# Patient Record
Sex: Male | Born: 2000 | Race: White | Hispanic: No | Marital: Single | Smoking: Never smoker
Health system: Southern US, Community
[De-identification: ages and names within clinical notes are randomized; demographics above are authoritative.]

## PROBLEM LIST (undated history)

## (undated) DIAGNOSIS — F4310 Post-traumatic stress disorder, unspecified: Secondary | ICD-10-CM

## (undated) DIAGNOSIS — T1491XA Suicide attempt, initial encounter: Secondary | ICD-10-CM

## (undated) DIAGNOSIS — F3181 Bipolar II disorder: Secondary | ICD-10-CM

## (undated) DIAGNOSIS — F32A Depression, unspecified: Secondary | ICD-10-CM

## (undated) DIAGNOSIS — F419 Anxiety disorder, unspecified: Secondary | ICD-10-CM

## (undated) DIAGNOSIS — S069X9A Unspecified intracranial injury with loss of consciousness of unspecified duration, initial encounter: Secondary | ICD-10-CM

## (undated) HISTORY — PX: WISDOM TOOTH EXTRACTION: SHX21

---
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
## 2018-02-04 ENCOUNTER — Emergency Department
Admission: EM | Admit: 2018-02-04 | Discharge: 2018-02-05 | Disposition: A | Discharge status: Home / Self Care | Payer: 59 | Attending: Emergency Medicine | Admitting: Emergency Medicine

## 2018-02-04 ENCOUNTER — Other Ambulatory Visit: Payer: Self-pay

## 2018-02-04 ENCOUNTER — Emergency Department: Payer: 59

## 2018-02-04 DIAGNOSIS — R200 Anesthesia of skin: Secondary | ICD-10-CM | POA: Insufficient documentation

## 2018-02-04 DIAGNOSIS — F909 Attention-deficit hyperactivity disorder, unspecified type: Secondary | ICD-10-CM | POA: Diagnosis not present

## 2018-02-04 DIAGNOSIS — R202 Paresthesia of skin: Secondary | ICD-10-CM | POA: Diagnosis not present

## 2018-02-04 DIAGNOSIS — R0789 Other chest pain: Secondary | ICD-10-CM | POA: Diagnosis not present

## 2018-02-04 DIAGNOSIS — R0602 Shortness of breath: Secondary | ICD-10-CM | POA: Insufficient documentation

## 2018-02-04 LAB — CBC WITH DIFFERENTIAL/PLATELET
Abs Immature Granulocytes: 0.05 10*3/uL (ref 0.00–0.07)
BASOS ABS: 0.1 10*3/uL (ref 0.0–0.1)
Basophils Relative: 1 %
EOS PCT: 3 %
Eosinophils Absolute: 0.2 10*3/uL (ref 0.0–0.5)
HCT: 43.4 % (ref 39.0–52.0)
HEMOGLOBIN: 14.9 g/dL (ref 13.0–17.0)
IMMATURE GRANULOCYTES: 1 %
LYMPHS PCT: 37 %
Lymphs Abs: 2.7 10*3/uL (ref 0.7–4.0)
MCH: 26.8 pg (ref 26.0–34.0)
MCHC: 34.3 g/dL (ref 30.0–36.0)
MCV: 78.2 fL — ABNORMAL LOW (ref 80.0–100.0)
Monocytes Absolute: 0.5 10*3/uL (ref 0.1–1.0)
Monocytes Relative: 6 %
NEUTROS ABS: 3.9 10*3/uL (ref 1.7–7.7)
NEUTROS PCT: 52 %
NRBC: 0 % (ref 0.0–0.2)
Platelets: 277 10*3/uL (ref 150–400)
RBC: 5.55 MIL/uL (ref 4.22–5.81)
RDW: 12 % (ref 11.5–15.5)
WBC: 7.4 10*3/uL (ref 4.0–10.5)

## 2018-02-04 LAB — BASIC METABOLIC PANEL
ANION GAP: 13 (ref 5–15)
BUN: 19 mg/dL (ref 6–20)
CALCIUM: 9.7 mg/dL (ref 8.9–10.3)
CHLORIDE: 107 mmol/L (ref 98–111)
CO2: 19 mmol/L — ABNORMAL LOW (ref 22–32)
CREATININE: 1.09 mg/dL (ref 0.61–1.24)
GFR calc non Af Amer: >60 mL/min (ref >60)
Glucose, Bld: 97 mg/dL (ref 70–99)
Potassium: 3.1 mmol/L — ABNORMAL LOW (ref 3.5–5.1)
SODIUM: 139 mmol/L (ref 135–145)

## 2018-02-04 LAB — TROPONIN I

## 2018-02-04 MED ORDER — LORAZEPAM 1 MG PO TABS
1.0000 mg | ORAL_TABLET | Freq: Once | ORAL | Status: DC
  Filled 2018-02-04: qty 1

## 2018-02-04 NOTE — ED Triage Notes (Signed)
Pt in with co shob for few days no hx of lung disease, pt with forced breathing noted in triage. States feels like its due to new medication states it's related to new ADHD med called "buprofen".

## 2018-02-04 NOTE — ED Provider Notes (Signed)
[facility] Emergency Department Provider Note  ____________________________________________   I have reviewed the triage vital signs and the nursing notes.   HISTORY  Chief Complaint Shortness of Breath   History limited by: Not Limited   HPI Devon Lopez is a 17 y.o. male who presents to the emergency department today because of concerns for shortness of breath.  He states that he is had episodes of what he calls panic-like attacks over the past few weeks.  He thinks it is related to a new ADHD medication that he started a few months ago.  He states that when he gets these episodes he becomes very short of breath.  He has associated chest tightness.  He also describes some numbness and tingling in his extremities.  The patient states that he had similar episode one time prior to starting this medication.  Denies any trauma to his chest.  No fevers.   Per medical record review patient has a history of ADHD  No past medical history on file.  There are no active problems to display for this patient.     Prior to Admission medications   Not on File    Allergies Patient has no allergy information on record.  No family history on file.  Social History Social History   Tobacco Use  . Smoking status: Not on file  Substance Use Topics  . Alcohol use: Not on file  . Drug use: Not on file    Review of Systems Constitutional: No fever/chills Eyes: No visual changes. ENT: No sore throat. Cardiovascular: Positive for chest tightness Respiratory: Positive for shortness of breath. Gastrointestinal: No abdominal pain.  No nausea, no vomiting.  No diarrhea.   Genitourinary: Negative for dysuria. Musculoskeletal: Negative for back pain. Skin: Negative for rash. Neurological: Negative for headaches, focal weakness or numbness.  ____________________________________________   PHYSICAL EXAM:  VITAL SIGNS: ED Triage Vitals  Enc Vitals Group      BP 02/04/18 2202 133/64     Pulse Rate 02/04/18 2202 94     Resp 02/04/18 2202 20     Temp 02/04/18 2204 (!) 96.8 F (36 C)     Temp Source 02/04/18 2204 Oral     SpO2 02/04/18 2202 100 %     Weight 02/04/18 2203 164 lb (74.4 kg)     Height --      Head Circumference --      Peak Flow --      Pain Score 02/04/18 2203 0   Constitutional: Alert and oriented. Slightly anxious appearing. Eyes: Conjunctivae are normal.  ENT      Head: Normocephalic and atraumatic.      Nose: No congestion/rhinnorhea.      Mouth/Throat: Mucous membranes are moist.      Neck: No stridor. Hematological/Lymphatic/Immunilogical: No cervical lymphadenopathy. Cardiovascular: Normal rate, regular rhythm.  No murmurs, rubs, or gallops.  Respiratory: Normal respiratory effort without tachypnea nor retractions. Breath sounds are clear and equal bilaterally. No wheezes/rales/rhonchi. Gastrointestinal: Soft and non tender. No rebound. No guarding.  Genitourinary: Deferred Musculoskeletal: Normal range of motion in all extremities. No lower extremity edema. Neurologic:  Normal speech and language. No gross focal neurologic deficits are appreciated.  Skin:  Skin is warm, dry and intact. No rash noted. Psychiatric: Mood and affect are normal. Speech and behavior are normal. Patient exhibits appropriate insight and judgment.  ____________________________________________    LABS (pertinent positives/negatives)  Trop <0.03 BMP na 139, k 3.1, cr 1.09 CBC wbc 7.4,  hgb 14.9, plt 277  ____________________________________________   EKG  I, Phineas SemenGraydon Yona Stansbury, attending physician, personally viewed and interpreted this EKG  EKG Time: 2254 Rate: 70 Rhythm: sinus rhythm Axis: normal Intervals: qtc 440 QRS: narrow ST changes: st elevation v3 consistent with early repolerization Impression: normal ekg   ____________________________________________    RADIOLOGY  CXR No acute  disease  ____________________________________________   PROCEDURES  Procedures  ____________________________________________   INITIAL IMPRESSION / ASSESSMENT AND PLAN / ED COURSE  Pertinent labs & imaging results that were available during my care of the patient were reviewed by me and considered in my medical decision making (see chart for details).   Patient presented to the emergency department today because of concerns for chest tightness and shortness of breath.  Patient thinks that this may be related to medication.  Patient's chest x-ray EKG and blood work without any concerning findings.  Patient did improve during his course here in the emergency department.  I do wonder if anxiety attack is playing a role.  Discussed with patient importance of following up with his prescribing physician.  ____________________________________________   FINAL CLINICAL IMPRESSION(S) / ED DIAGNOSES  Final diagnoses:  Shortness of breath  Chest tightness     Note: This dictation was prepared with Dragon dictation. Any transcriptional errors that result from this process are unintentional     Phineas SemenGoodman, Alleta Avery, MD 02/04/18 2348

## 2018-02-04 NOTE — Discharge Instructions (Addendum)
Please seek medical attention for any high fevers, chest pain, shortness of breath, change in behavior, persistent vomiting, bloody stool or any other new or concerning symptoms.  

## 2018-12-19 ENCOUNTER — Other Ambulatory Visit: Payer: Self-pay

## 2018-12-19 ENCOUNTER — Emergency Department
Admission: EM | Admit: 2018-12-19 | Discharge: 2018-12-19 | Disposition: A | Discharge status: Home / Self Care | Payer: No Typology Code available for payment source | Attending: Emergency Medicine | Admitting: Emergency Medicine

## 2018-12-19 DIAGNOSIS — T7840XA Allergy, unspecified, initial encounter: Secondary | ICD-10-CM

## 2018-12-19 LAB — CBC WITH DIFFERENTIAL/PLATELET
Abs Immature Granulocytes: 0.04 10*3/uL (ref 0.00–0.07)
Basophils Absolute: 0.1 10*3/uL (ref 0.0–0.1)
Basophils Relative: 1 %
Eosinophils Absolute: 0.1 10*3/uL (ref 0.0–0.5)
Eosinophils Relative: 2 %
HCT: 42.7 % (ref 39.0–52.0)
Hemoglobin: 14.1 g/dL (ref 13.0–17.0)
Immature Granulocytes: 1 %
Lymphocytes Relative: 39 %
Lymphs Abs: 2.5 10*3/uL (ref 0.7–4.0)
MCH: 26.9 pg (ref 26.0–34.0)
MCHC: 33 g/dL (ref 30.0–36.0)
MCV: 81.5 fL (ref 80.0–100.0)
Monocytes Absolute: 0.5 10*3/uL (ref 0.1–1.0)
Monocytes Relative: 7 %
Neutro Abs: 3.2 10*3/uL (ref 1.7–7.7)
Neutrophils Relative %: 50 %
Platelets: 281 10*3/uL (ref 150–400)
RBC: 5.24 MIL/uL (ref 4.22–5.81)
RDW: 12.1 % (ref 11.5–15.5)
WBC: 6.4 10*3/uL (ref 4.0–10.5)
nRBC: 0 % (ref 0.0–0.2)

## 2018-12-19 LAB — BASIC METABOLIC PANEL
Anion gap: 7 (ref 5–15)
BUN: 17 mg/dL (ref 6–20)
CO2: 27 mmol/L (ref 22–32)
Calcium: 9 mg/dL (ref 8.9–10.3)
Chloride: 105 mmol/L (ref 98–111)
Creatinine, Ser: 1.02 mg/dL (ref 0.61–1.24)
GFR calc Af Amer: >60 mL/min (ref >60)
GFR calc non Af Amer: >60 mL/min (ref >60)
Glucose, Bld: 91 mg/dL (ref 70–99)
Potassium: 4.7 mmol/L (ref 3.5–5.1)
Sodium: 139 mmol/L (ref 135–145)

## 2018-12-19 MED ORDER — PREDNISONE 20 MG PO TABS
60.0000 mg | ORAL_TABLET | Freq: Once | ORAL | Status: AC
  Administered 2018-12-19: 60 mg via ORAL
  Filled 2018-12-19: qty 3

## 2018-12-19 MED ORDER — PREDNISONE 20 MG PO TABS: 40.0000 mg | ORAL_TABLET | Freq: Every day | ORAL | 0 refills | Status: AC

## 2018-12-19 MED ORDER — EPINEPHRINE 0.3 MG/0.3ML IJ SOAJ: 0.3000 mg | INTRAMUSCULAR | 0 refills | Status: DC | PRN

## 2018-12-19 NOTE — ED Provider Notes (Signed)
[facility] Emergency Department Provider Note ____________________________________________   First MD Initiated Contact with Patient 12/19/18 1731     (approximate)  I have reviewed the triage vital signs and the nursing notes.   HISTORY  Chief Complaint Allergic Reaction    HPI Devon Lopez is a 18 y.o. male with PMH as noted below who presents with itching and rash, occurring over the last 4 days, present most the time but intermittently worsened, and acutely worsened today.  The patient reports associated hives.  He denies tightness in his throat, tongue or lip swelling, or shortness of breath.  The patient reports that he has been off of his Klonopin for the last week after a mixup with his prescription.  He denies any other new medications or exposures.  Past Medical History:  Diagnosis Date  . TBI (traumatic brain injury) ([place]) 2016    There are no active problems to display for this patient.   History reviewed. No pertinent surgical history.  Prior to Admission medications   Medication Sig Start Date End Date Taking? Authorizing Provider  EPINEPHrine (EPIPEN 2-PAK) 0.3 mg/0.3 mL IJ SOAJ injection Inject 0.3 mLs (0.3 mg total) into the muscle as needed for anaphylaxis. 12/19/18   Arta Silence, MD  predniSONE (DELTASONE) 20 MG tablet Take 2 tablets (40 mg total) by mouth daily for 4 days. Start the day after the ED visit 12/20/18 12/24/18  Arta Silence, MD    Allergies Patient has no known allergies.  No family history on file.  Social History Social History   Tobacco Use  . Smoking status: Never Smoker  . Smokeless tobacco: Never Used  Substance Use Topics  . Alcohol use: Never    Frequency: Never  . Drug use: Never    Review of Systems  Constitutional: No fever. Eyes: No redness. ENT: No sore throat. Cardiovascular: Denies chest pain. Respiratory: Denies shortness of breath. Gastrointestinal: No vomiting or  diarrhea.  Genitourinary: Negative for dysuria.  Musculoskeletal: Negative for back pain. Skin: Negative for rash. Neurological: Negative for headache.   ____________________________________________   PHYSICAL EXAM:  VITAL SIGNS: ED Triage Vitals  Enc Vitals Group     BP 12/19/18 1730 (!) 99/59     Pulse Rate 12/19/18 1730 (!) 56     Resp 12/19/18 1730 18     Temp 12/19/18 1730 98.2 F (36.8 C)     Temp Source 12/19/18 1730 Oral     SpO2 12/19/18 1730 100 %     Weight 12/19/18 1726 150 lb (68 kg)     Height 12/19/18 1726 6' (1.829 m)     Head Circumference --      Peak Flow --      Pain Score 12/19/18 1726 6     Pain Loc --      Pain Edu? --      Excl. in [place]? --     Constitutional: Alert and oriented. Well appearing and in no acute distress. Eyes: Conjunctivae are normal.  Head: Atraumatic. Nose: No congestion/rhinnorhea. Mouth/Throat: Mucous membranes are moist.  Oropharynx clear with no swelling or pooled secretions.  No stridor. Neck: Normal range of motion.  Cardiovascular: Normal rate, regular rhythm.Good peripheral circulation. Respiratory: Normal respiratory effort.  No retractions. Gastrointestinal: No distention.  Musculoskeletal:  Extremities warm and well perfused.  Neurologic:  Normal speech and language. No gross focal neurologic deficits are appreciated.  Skin:  Skin is warm and dry. No rash noted. Psychiatric: Mood and affect are  normal. Speech and behavior are normal.  ____________________________________________   LABS (all labs ordered are listed, but only abnormal results are displayed)  Labs Reviewed  BASIC METABOLIC PANEL  CBC WITH DIFFERENTIAL/PLATELET   ____________________________________________  EKG   ____________________________________________  RADIOLOGY    ____________________________________________   PROCEDURES  Procedure(s) performed: No  Procedures  Critical Care performed: No  ____________________________________________   INITIAL IMPRESSION / ASSESSMENT AND PLAN / ED COURSE  Pertinent labs & imaging results that were available during my care of the patient were reviewed by me and considered in my medical decision making (see chart for details).  18 year old male with PMH as noted above and no known allergies or history of prior allergic reactions presents with intermittent generalized itching and hives over the last 4 days.  He cannot identify anything specific he may have been exposed to.  He does report that he recently ran out of Klonopin about a week ago due to a mixup with his prescription.  He denies any other medication changes.  On exam, the patient is well-appearing.  His vital signs are normal except for borderline low blood pressure although clinically his extremities appear well-perfused so this may be a spurious reading.  He apparently had hives earlier with EMS but no longer has any rash or other abnormal exam findings.  He received Benadryl and Pepcid by EMS.  Overall presentation is most consistent with an allergic reaction although the patient has no evidence of airway involvement.  The symptoms appear to have almost completely resolved with Benadryl.  There is no evidence of withdrawal to benzodiazepines.  I will give prednisone, obtain basic labs, and observe the patient briefly.  ----------------------------------------- 6:55 PM on 12/19/2018 -----------------------------------------  The patient remains asymptomatic and well-appearing.  He is stable for discharge home.  His mother is now present and reports that she recently started using a Lysol detergent on their clothing in the last week, which certainly could be precipitating the patient's persistent allergic reactions.  I instructed them to discontinue this product.  I will give the patient a course of prednisone, and I have also prescribed an EpiPen.  Return precautions given, and the  patient expresses understanding. ____________________________________________   FINAL CLINICAL IMPRESSION(S) / ED DIAGNOSES  Final diagnoses:  Allergic reaction, initial encounter      NEW MEDICATIONS STARTED DURING THIS VISIT:  New Prescriptions   EPINEPHRINE (EPIPEN 2-PAK) 0.3 MG/0.3 ML IJ SOAJ INJECTION    Inject 0.3 mLs (0.3 mg total) into the muscle as needed for anaphylaxis.   PREDNISONE (DELTASONE) 20 MG TABLET    Take 2 tablets (40 mg total) by mouth daily for 4 days. Start the day after the ED visit     Note:  This document was prepared using Dragon voice recognition software and may include unintentional dictation errors.    Dionne Bucy, MD 12/19/18 [phone]

## 2018-12-19 NOTE — Discharge Instructions (Addendum)
Return to the ER for new, worsening, or persistent itching, rash, hives, shortness of breath, tightness in her throat, or any other new or worsening symptoms that concern you.  Stop using the Lysol product that you mentioned as it may be because of your reaction.

## 2018-12-19 NOTE — ED Triage Notes (Signed)
Itching starting Sunday, uknown to what. No other symptoms. Itching got worse today. PT alert oriented in no obvious distress. PT has not has klonopine in a few days.

## 2019-03-26 ENCOUNTER — Telehealth: Payer: Self-pay | Admitting: Pharmacy Technician

## 2019-03-26 NOTE — Telephone Encounter (Signed)
Patient failed to provide requested 2020 financial documentation. No additional medication assistance will be provided by [facility] without the required proof of income documentation. Patient notified by letter  Torin Modica CPhT [facility] 

## 2019-04-06 ENCOUNTER — Other Ambulatory Visit: Payer: Self-pay

## 2019-04-06 ENCOUNTER — Ambulatory Visit: Payer: Self-pay | Admitting: Pharmacy Technician

## 2019-04-06 DIAGNOSIS — Z79899 Other long term (current) drug therapy: Secondary | ICD-10-CM

## 2019-04-06 NOTE — Progress Notes (Signed)
Provided patient with contact information to inquire about financial assistance for [facility].    Completed [facility] application and contract.  Patient agreed to all terms of the [facility] contract.    Patient to provide paystubs and bank statement for 2021 and 2019 tax return.  Patient will provide 2020 tax return once it is filed.  Patient verbally acknowledged that he understood that no additional medication assistance would be provided until all requested financial information had been provided.    Provided patient with Community education officer based on his particular needs.    Sherilyn Dacosta Care Manager [facility]

## 2019-06-05 ENCOUNTER — Telehealth: Payer: Self-pay | Admitting: Pharmacy Technician

## 2019-06-05 NOTE — Telephone Encounter (Signed)
Patient failed to provide 2021 proof of income.  No additional medication assistance will be provided by [facility] without the required proof of income documentation.  Patient notified by letter.  Devon Lopez J. Devon Lopez Care Manager [facility]   [address] [place], [place]  [address]     This is to inform you that you are no longer eligible to receive medication assistance at [facility].  The reason(s) are:    _____Your total gross monthly household income exceeds 250% of the Federal Poverty Level.   _____Tangible assets (savings, checking, stocks/bonds, pension, retirement, etc.) exceeds our limit  _____You are eligible to receive benefits from Medicaid, [facility] or HIV Medication              Assistance Program _____You are eligible to receive benefits from a Medicare Part "D" plan _____You have prescription insurance  _____You are not an [place] resident __X__Failure to provide all requested proof of income information for 2021.    Medication assistance will resume once all requested financial information has been returned to our clinic.  If you have questions, please contact our clinic at [phone].    Thank you,  [facility] 

## 2019-06-10 ENCOUNTER — Other Ambulatory Visit: Payer: Self-pay

## 2019-06-10 ENCOUNTER — Ambulatory Visit: Payer: Self-pay | Admitting: Pharmacy Technician

## 2019-06-10 DIAGNOSIS — Z79899 Other long term (current) drug therapy: Secondary | ICD-10-CM

## 2019-06-10 NOTE — Progress Notes (Signed)
Completed [facility] application and contract.  Patient agreed to all terms of the [facility] contract.    Patient still needs to provide paystubs and bank statement for 2021.  Patient attempted to retrieve this information using a computer at [facility].  Patient was unsuccessful.  Patient also attempted to email this financial information but was having difficulty.  Patient is to go to [employer] and obtain requested information and bring back to [facility].  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  Offered patient [employer].  Patient refused.  Stated that when he uses [place] it ties him up for 4 hours.    Provided patient with a two week supply of medication.  Explained that no additional medication assistance will be provided without the requested proof of income.  Patient acknowledged that he understood.   Sherilyn Dacosta Care Manager [facility]

## 2019-06-24 ENCOUNTER — Telehealth: Payer: Self-pay | Admitting: Pharmacy Technician

## 2019-06-24 NOTE — Telephone Encounter (Signed)
Patient presented requesting refills for medications: Quetiapine, Bupropion & Lamotrigine.  Has not provided requested financial information.  Patient stated that he will be providing the requested information on Monday, 06/29/19.  Provided patient with a 7 day supply of medications.  [facility] has been trying to obtain requested financial information from patient since January 2021.  Made patient aware that no additional medication will be provided after this fill until requested financial information in given to [facility].  Patient acknowledged that he understood.  Sherilyn Dacosta Care Manager [facility]

## 2019-07-02 ENCOUNTER — Telehealth: Payer: Self-pay | Admitting: Pharmacy Technician

## 2019-07-02 NOTE — Telephone Encounter (Signed)
Received updated proof of income.  Patient eligible to receive medication assistance at [facility] until time for re-certification in 9359, and as long as eligibility requirements continue to be met.  [place] [facility]

## 2019-08-31 ENCOUNTER — Telehealth: Payer: Self-pay | Admitting: Pharmacist

## 2019-08-31 NOTE — Telephone Encounter (Signed)
Called provider office. Receptionist asked to have patient call their office to discuss his dose.  Patient called and he confirmed understanding.   Maisha Bogen K. Joelene Millin, PharmD [facility]Ph[facility] Operations Coordinator [phone]

## 2019-08-31 NOTE — Telephone Encounter (Signed)
Patient came in today to pick up medications that were filled last week. One of his medications is lamotrigine 75 mg daily. Per refill history, patient should have been out about a month ago. Concerned patient was not taking and at risk for Stevens-Johnson syndrome if started back to prescribed dose. Left message at his provider's office 08/27/19 to discuss. Patient was seen on 08/26/19. Per receptionist, the MD noted to continue same dose. Receptionist was going to follow up with C[facility].  No follow-up received as of today. Per patient, he had extra bottles at home with medication and didn't want to keep filling in excess. Discussed my concern based on our refill history records that showed he was taking 3 tablets daily and potential risk for SJD if started back at that dose. Pt said, "oh, I have only been taking one tablet daily, I didn't realized the dose was 3 daily". Told him I would attempt to call his doctor to verify what he should do (stay on 25mg  daily or slowly titrate up to 75mg  daily.  Patient picked up 49 tablets today and will continue to take 1 tablet daily until I call him back.  Dorothy Polhemus K. , PharmD [facility]Pharm[facility] Operations Coordinator [phone]

## 2019-12-09 ENCOUNTER — Other Ambulatory Visit: Payer: Self-pay | Admitting: Nurse Practitioner

## 2020-06-05 IMAGING — CR DG CHEST 2V
2 series · 2 of 2 positions shown · non-contrast
Comparison: None.

CLINICAL DATA: Shortness of breath for several days.

EXAM:
CHEST - 2 VIEW

[chest lat]
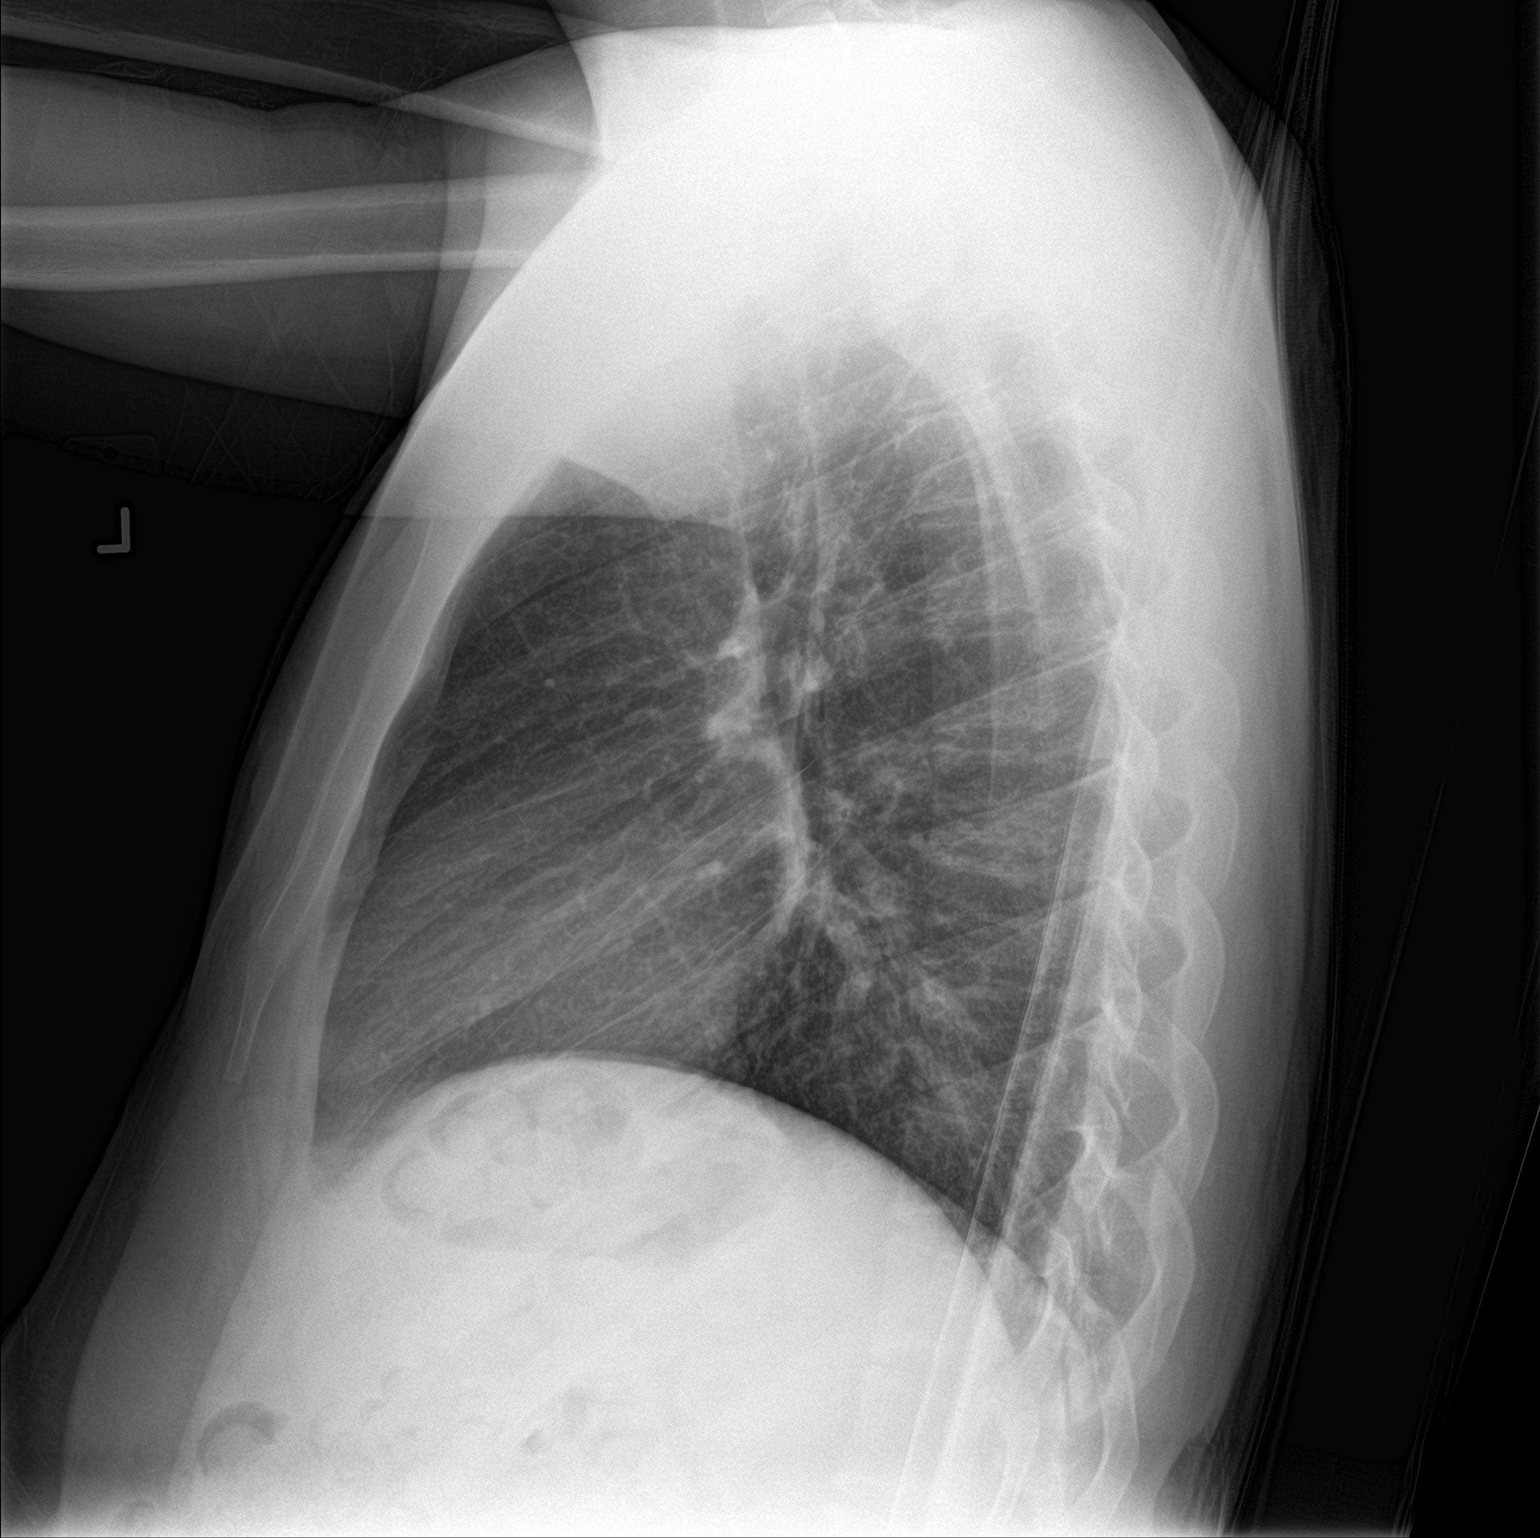

[chest ap]
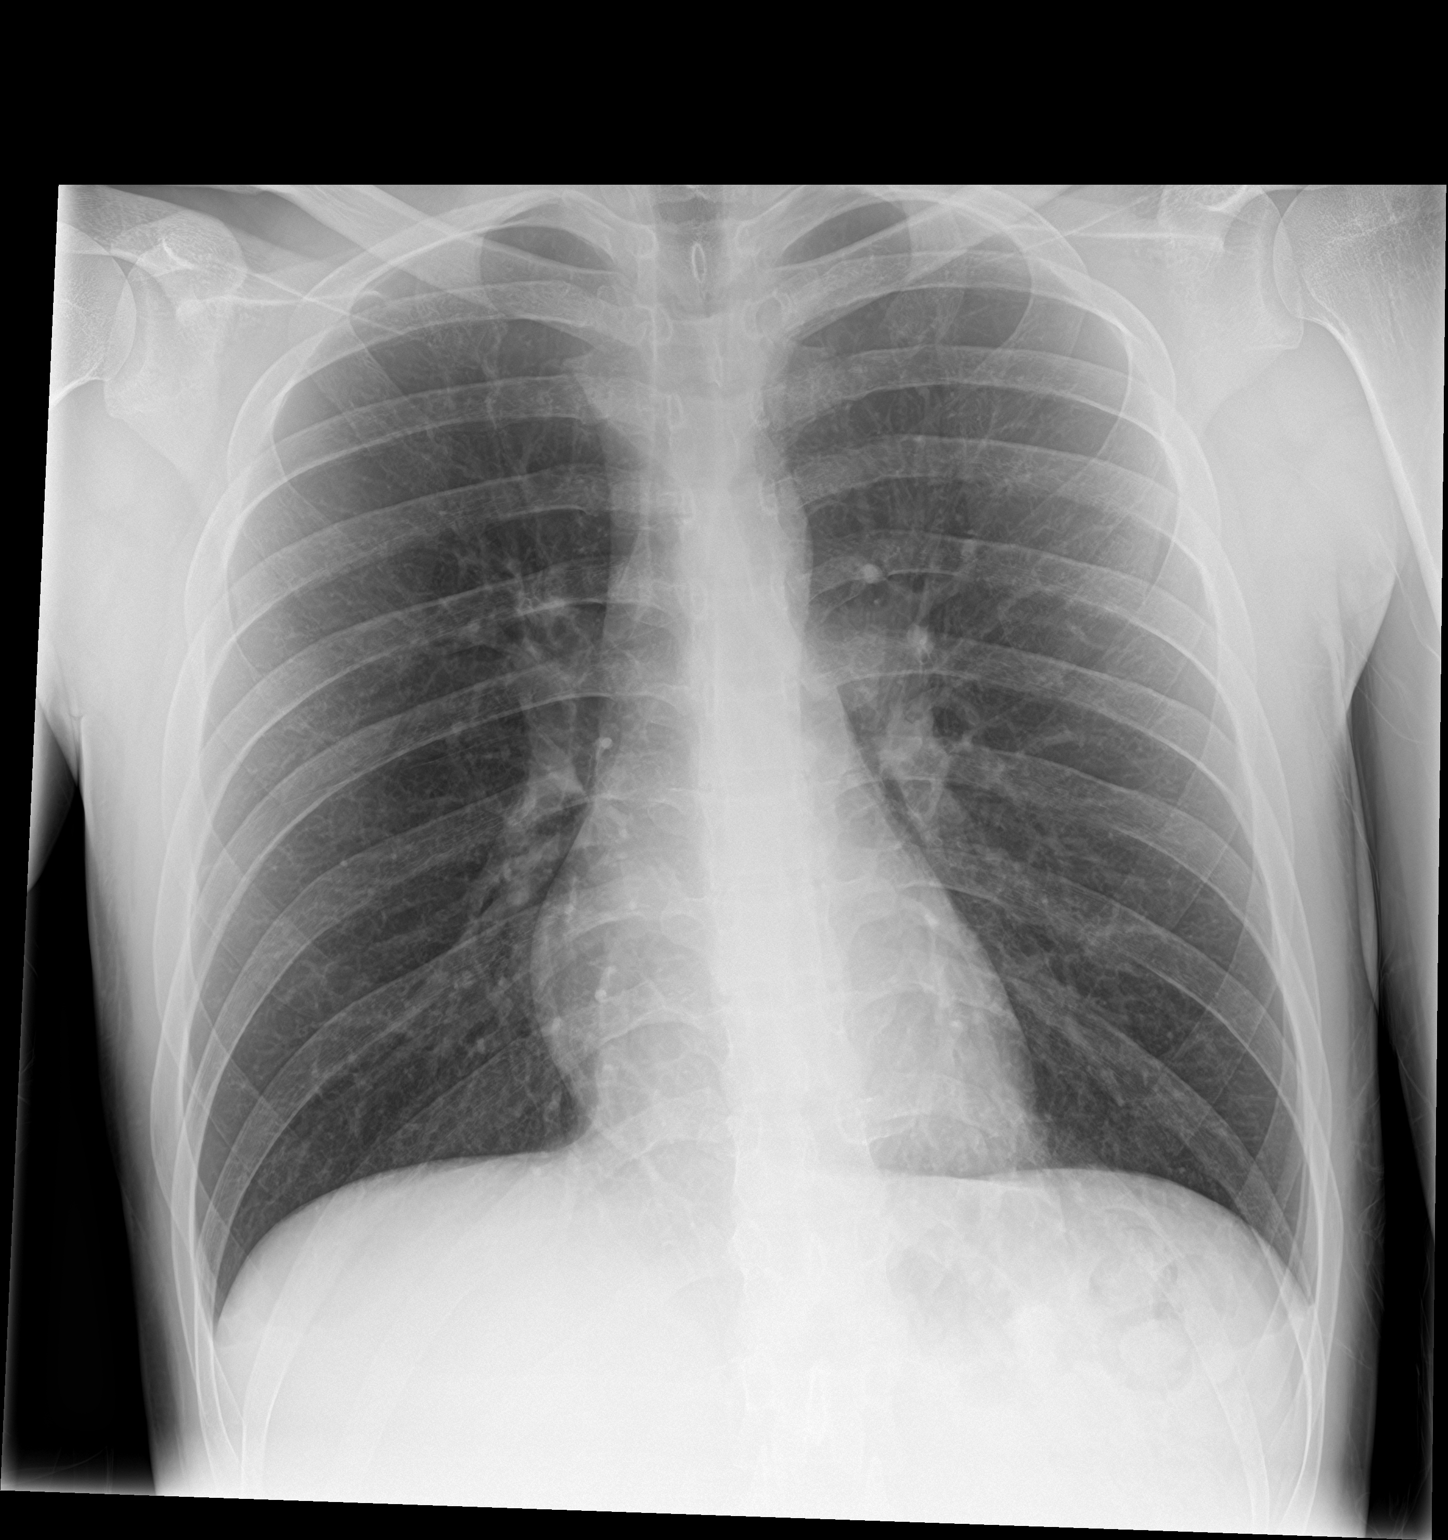

[2 of 2 positions shown; findings below may reference images not displayed]

FINDINGS: The heart size and mediastinal contours are within normal limits.
Both lungs are clear. The visualized skeletal structures are
unremarkable.
IMPRESSION: Normal study.  No active cardiopulmonary disease.

## 2020-07-21 ENCOUNTER — Emergency Department (HOSPITAL_BASED_OUTPATIENT_CLINIC_OR_DEPARTMENT_OTHER)
Admission: EM | Admit: 2020-07-21 | Discharge: 2020-07-21 | Disposition: A | Discharge status: Home / Self Care | Payer: Self-pay | Attending: Emergency Medicine | Admitting: Emergency Medicine

## 2020-07-21 ENCOUNTER — Encounter (HOSPITAL_BASED_OUTPATIENT_CLINIC_OR_DEPARTMENT_OTHER): Payer: Self-pay | Admitting: Emergency Medicine

## 2020-07-21 ENCOUNTER — Other Ambulatory Visit: Payer: Self-pay

## 2020-07-21 DIAGNOSIS — S71112A Laceration without foreign body, left thigh, initial encounter: Secondary | ICD-10-CM | POA: Insufficient documentation

## 2020-07-21 DIAGNOSIS — Y93K3 Activity, grooming and shearing an animal: Secondary | ICD-10-CM | POA: Insufficient documentation

## 2020-07-21 DIAGNOSIS — Z23 Encounter for immunization: Secondary | ICD-10-CM | POA: Insufficient documentation

## 2020-07-21 DIAGNOSIS — W268XXA Contact with other sharp object(s), not elsewhere classified, initial encounter: Secondary | ICD-10-CM | POA: Insufficient documentation

## 2020-07-21 MED ORDER — TETANUS-DIPHTH-ACELL PERTUSSIS 5-2.5-18.5 LF-MCG/0.5 IM SUSY
0.5000 mL | PREFILLED_SYRINGE | Freq: Once | INTRAMUSCULAR | Status: AC
  Administered 2020-07-21: 0.5 mL via INTRAMUSCULAR
  Filled 2020-07-21: qty 0.5

## 2020-07-21 NOTE — Discharge Instructions (Signed)
Place Vaseline over the wound and a bandage daily.  It is fine to wash with soap and water.

## 2020-07-21 NOTE — ED Triage Notes (Signed)
Patient states was shaving upper leg with razor. 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 Hand jerked and pulled razor across thigh. Noted multiple white line scarring to upper left thigh.

## 2020-07-21 NOTE — ED Provider Notes (Signed)
[facility] EMERGENCY DEPARTMENT Provider Note   CSN: [id number] Arrival date & time: 07/21/20  6761     History Chief Complaint  Patient presents with  . Extremity Laceration    Devon Lopez is a 20 y.o. male.  The history is provided by the patient.  Laceration Location:  Leg Leg laceration location:  L upper leg Length:  5cm Depth:  Cutaneous Quality: straight   Bleeding: controlled   Time since incident:  1 hour Laceration mechanism:  Razor (was shaving today and his arm jerked causing him to cut his leg) Pain details:    Quality:  Aching   Severity:  Mild   Timing:  Constant   Progression:  Unchanged Foreign body present:  No foreign bodies Relieved by:  Pressure Worsened by:  Nothing Ineffective treatments:  None tried Tetanus status:  Out of date Associated symptoms comment:  Bleeding has stopped and otherwise feeling fine      Past Medical History:  Diagnosis Date  . TBI (traumatic brain injury) ([facility]) 2016    There are no problems to display for this patient.   History reviewed. No pertinent surgical history.     History reviewed. No pertinent family history.  Social History   Tobacco Use  . Smoking status: Never Smoker  . Smokeless tobacco: Never Used  Vaping Use  . Vaping Use: Never used  Substance Use Topics  . Alcohol use: Never  . Drug use: Never    Home Medications Prior to Admission medications   Medication Sig Start Date End Date Taking? Authorizing Provider  EPINEPHrine (EPIPEN 2-PAK) 0.3 mg/0.3 mL IJ SOAJ injection Inject 0.3 mLs (0.3 mg total) into the muscle as needed for anaphylaxis. 12/19/18   Dionne Bucy, MD  hydrOXYzine (ATARAX/VISTARIL) 25 MG tablet TAKE 1 1/2 TABLETS BY MOUTH 3 TIMES A DAY AS NEEDED. 12/09/19 12/08/20  Zachery Dauer, FNP    Allergies    Patient has no known allergies.  Review of Systems   Review of Systems  All other systems reviewed and are negative.   Physical  Exam Updated Vital Signs BP 130/84 Comment: last b/p not accurate  Pulse 75   Temp 98.2 F (36.8 C) (Oral)   Resp 18   Ht 6' (1.829 m)   Wt 87.1 kg   SpO2 100%   BMI 26.04 kg/m   Physical Exam Vitals and nursing note reviewed.  Constitutional:      General: He is not in acute distress.    Appearance: Normal appearance. He is normal weight.  HENT:     Head: Normocephalic.  Cardiovascular:     Rate and Rhythm: Normal rate.     Pulses: Normal pulses.  Pulmonary:     Effort: Pulmonary effort is normal.  Musculoskeletal:        General: Signs of injury present.     Left upper leg: Laceration present.       Legs:  Skin:    General: Skin is warm and dry.  Neurological:     Mental Status: He is alert. Mental status is at baseline.  Psychiatric:        Mood and Affect: Mood normal.        Behavior: Behavior normal.     ED Results / Procedures / Treatments   Labs (all labs ordered are listed, but only abnormal results are displayed) Labs Reviewed - No data to display  EKG None  Radiology No results found.  Procedures Procedures   Medications  Ordered in ED Medications  Tdap (BOOSTRIX) injection 0.5 mL (has no administration in time range)    ED Course  I have reviewed the triage vital signs and the nursing notes.  Pertinent labs & imaging results that were available during my care of the patient were reviewed by me and considered in my medical decision making (see chart for details).    MDM Rules/Calculators/A&P                          Patient presenting today with a laceration of his thigh.  He reports his arm jerked and he cut himself with his razor.  Laceration does not go down to the subcutaneous tissue.  Patient's tetanus shot was updated.  He was given the option of repair with staples versus healing by secondary intention and he chooses healing by secondary intention.  Final Clinical Impression(s) / ED Diagnoses Final diagnoses:  Laceration of left  thigh, initial encounter    Rx / DC Orders ED Discharge Orders    None       Gwyneth Sprout, MD 07/21/20 [phone]

## 2020-07-21 NOTE — ED Notes (Signed)
Suture cart at room 

## 2020-09-30 ENCOUNTER — Other Ambulatory Visit: Payer: Self-pay

## 2021-02-23 ENCOUNTER — Other Ambulatory Visit: Payer: Self-pay

## 2021-08-19 ENCOUNTER — Emergency Department (HOSPITAL_BASED_OUTPATIENT_CLINIC_OR_DEPARTMENT_OTHER)
Admission: EM | Admit: 2021-08-19 | Discharge: 2021-08-19 | Disposition: A | Discharge status: Other Acute Inpatient Hospital | Payer: Self-pay | Attending: Emergency Medicine | Admitting: Emergency Medicine

## 2021-08-19 ENCOUNTER — Encounter (HOSPITAL_BASED_OUTPATIENT_CLINIC_OR_DEPARTMENT_OTHER): Payer: Self-pay | Admitting: Emergency Medicine

## 2021-08-19 ENCOUNTER — Other Ambulatory Visit: Payer: Self-pay

## 2021-08-19 ENCOUNTER — Ambulatory Visit (HOSPITAL_COMMUNITY)
Admission: EM | Admit: 2021-08-19 | Discharge: 2021-08-21 | Disposition: A | Discharge status: Other Acute Inpatient Hospital | Payer: No Payment, Other | Attending: Nurse Practitioner | Admitting: Nurse Practitioner

## 2021-08-19 DIAGNOSIS — R45851 Suicidal ideations: Secondary | ICD-10-CM

## 2021-08-19 DIAGNOSIS — F440 Dissociative amnesia: Secondary | ICD-10-CM | POA: Insufficient documentation

## 2021-08-19 DIAGNOSIS — F4310 Post-traumatic stress disorder, unspecified: Secondary | ICD-10-CM | POA: Insufficient documentation

## 2021-08-19 DIAGNOSIS — F4312 Post-traumatic stress disorder, chronic: Secondary | ICD-10-CM | POA: Diagnosis present

## 2021-08-19 DIAGNOSIS — S41111A Laceration without foreign body of right upper arm, initial encounter: Secondary | ICD-10-CM | POA: Insufficient documentation

## 2021-08-19 DIAGNOSIS — E119 Type 2 diabetes mellitus without complications: Secondary | ICD-10-CM | POA: Insufficient documentation

## 2021-08-19 DIAGNOSIS — S71112A Laceration without foreign body, left thigh, initial encounter: Secondary | ICD-10-CM | POA: Insufficient documentation

## 2021-08-19 DIAGNOSIS — F1220 Cannabis dependence, uncomplicated: Secondary | ICD-10-CM | POA: Diagnosis present

## 2021-08-19 DIAGNOSIS — Z20822 Contact with and (suspected) exposure to covid-19: Secondary | ICD-10-CM | POA: Insufficient documentation

## 2021-08-19 DIAGNOSIS — S41122A Laceration with foreign body of left upper arm, initial encounter: Secondary | ICD-10-CM | POA: Insufficient documentation

## 2021-08-19 DIAGNOSIS — Z9189 Other specified personal risk factors, not elsewhere classified: Secondary | ICD-10-CM

## 2021-08-19 DIAGNOSIS — F3181 Bipolar II disorder: Secondary | ICD-10-CM | POA: Diagnosis present

## 2021-08-19 DIAGNOSIS — X789XXA Intentional self-harm by unspecified sharp object, initial encounter: Secondary | ICD-10-CM | POA: Insufficient documentation

## 2021-08-19 DIAGNOSIS — F411 Generalized anxiety disorder: Secondary | ICD-10-CM | POA: Diagnosis present

## 2021-08-19 DIAGNOSIS — F3130 Bipolar disorder, current episode depressed, mild or moderate severity, unspecified: Secondary | ICD-10-CM | POA: Diagnosis present

## 2021-08-19 DIAGNOSIS — Y900 Blood alcohol level of less than 20 mg/100 ml: Secondary | ICD-10-CM | POA: Insufficient documentation

## 2021-08-19 DIAGNOSIS — F441 Dissociative fugue: Secondary | ICD-10-CM | POA: Diagnosis present

## 2021-08-19 HISTORY — DX: Suicide attempt, initial encounter: T14.91XA

## 2021-08-19 HISTORY — DX: Depression, unspecified: F32.A

## 2021-08-19 HISTORY — DX: Anxiety disorder, unspecified: F41.9

## 2021-08-19 HISTORY — DX: Post-traumatic stress disorder, unspecified: F43.10

## 2021-08-19 HISTORY — DX: Bipolar II disorder: F31.81

## 2021-08-19 LAB — COMPREHENSIVE METABOLIC PANEL
ALT: 25 U/L (ref 0–44)
AST: 21 U/L (ref 15–41)
Albumin: 4.3 g/dL (ref 3.5–5.0)
Alkaline Phosphatase: 69 U/L (ref 38–126)
Anion gap: 7 (ref 5–15)
BUN: 19 mg/dL (ref 6–20)
CO2: 23 mmol/L (ref 22–32)
Calcium: 9.4 mg/dL (ref 8.9–10.3)
Chloride: 109 mmol/L (ref 98–111)
Creatinine, Ser: 1.12 mg/dL (ref 0.61–1.24)
GFR, Estimated: >60 mL/min (ref >60)
Glucose, Bld: 130 mg/dL — ABNORMAL HIGH (ref 70–99)
Potassium: 3.8 mmol/L (ref 3.5–5.1)
Sodium: 139 mmol/L (ref 135–145)
Total Bilirubin: 0.5 mg/dL (ref 0.3–1.2)
Total Protein: 7.4 g/dL (ref 6.5–8.1)

## 2021-08-19 LAB — RESP PANEL BY RT-PCR (FLU A&B, COVID) ARPGX2
Influenza A by PCR: NEGATIVE
Influenza B by PCR: NEGATIVE
SARS Coronavirus 2 by RT PCR: NEGATIVE

## 2021-08-19 LAB — CBC WITH DIFFERENTIAL/PLATELET
Abs Immature Granulocytes: 0.01 10*3/uL (ref 0.00–0.07)
Basophils Absolute: 0 10*3/uL (ref 0.0–0.1)
Basophils Relative: 1 %
Eosinophils Absolute: 0.2 10*3/uL (ref 0.0–0.5)
Eosinophils Relative: 4 %
HCT: 44.1 % (ref 39.0–52.0)
Hemoglobin: 14.6 g/dL (ref 13.0–17.0)
Immature Granulocytes: 0 %
Lymphocytes Relative: 34 %
Lymphs Abs: 1.8 10*3/uL (ref 0.7–4.0)
MCH: 26.3 pg (ref 26.0–34.0)
MCHC: 33.1 g/dL (ref 30.0–36.0)
MCV: 79.3 fL — ABNORMAL LOW (ref 80.0–100.0)
Monocytes Absolute: 0.4 10*3/uL (ref 0.1–1.0)
Monocytes Relative: 7 %
Neutro Abs: 3 10*3/uL (ref 1.7–7.7)
Neutrophils Relative %: 54 %
Platelets: 265 10*3/uL (ref 150–400)
RBC: 5.56 MIL/uL (ref 4.22–5.81)
RDW: 12.7 % (ref 11.5–15.5)
WBC: 5.4 10*3/uL (ref 4.0–10.5)
nRBC: 0 % (ref 0.0–0.2)

## 2021-08-19 LAB — ETHANOL: Alcohol, Ethyl (B): <10 mg/dL (ref <10)

## 2021-08-19 LAB — RAPID URINE DRUG SCREEN, HOSP PERFORMED
Amphetamines: NOT DETECTED
Barbiturates: NOT DETECTED
Benzodiazepines: NOT DETECTED
Cocaine: NOT DETECTED
Opiates: NOT DETECTED
Tetrahydrocannabinol: POSITIVE — AB

## 2021-08-19 LAB — TSH: TSH: 1.531 u[IU]/mL (ref 0.350–4.500)

## 2021-08-19 NOTE — ED Notes (Signed)
Meatloaf, macaroni cheese dinner trays Water

## 2021-08-19 NOTE — ED Notes (Signed)
TTS consult complete  

## 2021-08-19 NOTE — ED Notes (Signed)
Called [facility] to find out when consult would be today he said there was 2 ahead of him at 6:12 stated that they will get to him as soon as they can.

## 2021-08-19 NOTE — ED Notes (Signed)
Patients girlfriend in room  Visitor wanded

## 2021-08-19 NOTE — ED Notes (Signed)
Patients girlfriend left

## 2021-08-19 NOTE — ED Notes (Addendum)
1 fork 1 spoon 2 frozen meal trays Removed from room Patient ate 100% of meal

## 2021-08-19 NOTE — ED Triage Notes (Signed)
Pt reports visual/auditory hallucinations; sts he has evidence of cutting self (arms and legs), but does not remember doing it

## 2021-08-19 NOTE — ED Notes (Addendum)
Patients girlfriend still visiting patient in room

## 2021-08-19 NOTE — ED Notes (Signed)
Pt A&O x 4, transfer from [facility], presents with passive suicidal ideations and self inflicted cut marks to Left arm. Auditory hallucinations noted.  Pt calm & cooperative at present, no distress noted.  Monitoring for safety.

## 2021-08-19 NOTE — ED Notes (Signed)
  TTS Consult

## 2021-08-19 NOTE — ED Notes (Signed)
Spoke with [place] in staffing and states no available sitter

## 2021-08-19 NOTE — ED Notes (Signed)
Patient is resting comfortably watching movie.

## 2021-08-19 NOTE — ED Notes (Signed)
Pt updated with plan of care
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
## 2021-08-19 NOTE — ED Notes (Signed)
Patient eating frozen dinner Spoon / meal tray and paper cup

## 2021-08-19 NOTE — ED Notes (Signed)
Tele-cart placed in patients room ready for consult

## 2021-08-19 NOTE — ED Notes (Signed)
Spoke with Dondra Spry at staffing . No sitter is available currently.

## 2021-08-19 NOTE — ED Provider Notes (Signed)
[facility] Provider Note   CSN: [id number] Arrival date & time: 08/19/21  1357     History  Chief Complaint  Patient presents with   Suicidal    Devon Lopez is a 21 y.o. male with a history of chronic diabetes, chronic depression, manic episodes and TBI at age 50 who presents to the ED for evaluation of self-harm and visual hallucinations.  Patient states that over the last month, he has episodes of memory lapses.  For example, he will leave the house and end up at a different location but he does not remember leaving the house or what he left.  Last night, he had another episode and when he "came to", he noticed self-inflicted linear lacerations to the bilateral upper extremities and the left lower extremity.  Patient states that he does not have suicidal intent or intent to self-harm and does not know why he has done this.  Patient states he had a suicidal attempt 2 years ago by attempting to swallow pills and a suicidal attempt when he was in middle school by drinking bleach.  Notes his most recent suicide attempt, he has not had suicidal ideation and reports being quite happy at school and with his girlfriend.  He also endorses occasional visual hallucinations such as spiders crawling across the skin, most recently seen about 1 week ago.  Currently denies homicidal ideation.  He denies drug use aside from smoking legally obtained delta 8.  HPI     Home Medications Prior to Admission medications   Medication Sig Start Date End Date Taking? Authorizing Provider  EPINEPHrine (EPIPEN 2-PAK) 0.3 mg/0.3 mL IJ SOAJ injection Inject 0.3 mLs (0.3 mg total) into the muscle as needed for anaphylaxis. 12/19/18   Arta Silence, MD      Allergies    Patient has no known allergies.    Review of Systems   Review of Systems  Physical Exam Updated Vital Signs BP (!) 145/75   Pulse 68   Temp 97.8 F (36.6 C) (Oral)   Resp 16   Ht 6' (1.829 m)   Wt  86.2 kg   SpO2 94%   BMI 25.77 kg/m  Physical Exam Vitals and nursing note reviewed.  Constitutional:      General: He is not in acute distress.    Appearance: He is not ill-appearing.  HENT:     Head: Atraumatic.  Eyes:     Conjunctiva/sclera: Conjunctivae normal.  Cardiovascular:     Rate and Rhythm: Normal rate and regular rhythm.     Pulses: Normal pulses.     Heart sounds: No murmur heard. Pulmonary:     Effort: Pulmonary effort is normal. No respiratory distress.     Breath sounds: Normal breath sounds.  Abdominal:     General: Abdomen is flat. There is no distension.     Palpations: Abdomen is soft.     Tenderness: There is no abdominal tenderness.  Musculoskeletal:        General: Normal range of motion.     Cervical back: Normal range of motion.  Skin:    General: Skin is warm and dry.     Capillary Refill: Capillary refill takes less than 2 seconds.     Comments: Numerous shallow lacerations on the bilateral upper extremities.  2 superficial although slightly more severe lacerations on the left upper thigh.  Long healed self-harm scars noted on the upper and lower extremities.  Patient states these are from several years  ago.  Neurological:     General: No focal deficit present.     Mental Status: He is alert.  Psychiatric:        Attention and Perception: He perceives visual hallucinations.        Mood and Affect: Mood normal.        Thought Content: Thought content does not include homicidal ideation.     ED Results / Procedures / Treatments   Labs (all labs ordered are listed, but only abnormal results are displayed) Labs Reviewed - No data to display  EKG None  Radiology No results found.  Procedures Procedures    Medications Ordered in ED Medications - No data to display  ED Course/ Medical Decision Making/ A&P                           Medical Decision Making Amount and/or Complexity of Data Reviewed Labs: ordered.   Social  determinants of health:  Social History   Socioeconomic History   Marital status: Single    Spouse name: Not on file   Number of children: Not on file   Years of education: Not on file   Highest education level: Not on file  Occupational History   Not on file  Tobacco Use   Smoking status: Never   Smokeless tobacco: Never  Vaping Use   Vaping Use: Never used  Substance and Sexual Activity   Alcohol use: Never   Drug use: Yes    Types: Marijuana   Sexual activity: Not on file  Other Topics Concern   Not on file  Social History Narrative   Not on file   Social Determinants of Health   Financial Resource Strain: Not on file  Food Insecurity: Not on file  Transportation Needs: Not on file  Physical Activity: Not on file  Stress: Not on file  Social Connections: Not on file  Intimate Partner Violence: Not on file     Initial impression:  This patient presents to the ED for concern of self-harm without memory, this involves an extensive number of treatment options, and is a complaint that carries with it a high risk of complications and morbidity.    Comorbidities affecting care:  Manic, depression, anxiety  Additional history obtained: Girlfriend  Lab Tests  I Ordered, reviewed, and interpreted labs and EKG.  The pertinent results include:  CBC, CMP, ethanol normal UDS positive for THC EKG: Sinus rhythm  ED Course/Re-evaluation: 21 year old male comes into the emergency department of self-harm of the bilateral upper and lower extremities without memory.  Vitals without significant abnormality.  On exam, patient is well spoken and does not appear acutely psychotic.  He has superficial lacerations to the upper extremities and slightly deeper although still superficial lacerations to the left upper thigh.  No signs of infection or abscess.  Labs without acute findings.  EKG normal.  Medically, patient is cleared for TTS consult. Pt care handed off to Evlyn Courier, PA-C  at shift change who will monitor for TTS evaluation and determine dispo based on their recommendations.   Final Clinical Impression(s) / ED Diagnoses Final diagnoses:  None    Rx / DC Orders ED Discharge Orders     None         Tonye Pearson, PA-C 08/19/21 1853    Malvin Johns, MD 08/20/21 1501

## 2021-08-20 DIAGNOSIS — F1220 Cannabis dependence, uncomplicated: Secondary | ICD-10-CM | POA: Diagnosis present

## 2021-08-20 DIAGNOSIS — F441 Dissociative fugue: Secondary | ICD-10-CM | POA: Diagnosis present

## 2021-08-20 DIAGNOSIS — F411 Generalized anxiety disorder: Secondary | ICD-10-CM | POA: Diagnosis present

## 2021-08-20 DIAGNOSIS — F3181 Bipolar II disorder: Secondary | ICD-10-CM | POA: Diagnosis present

## 2021-08-20 DIAGNOSIS — F3130 Bipolar disorder, current episode depressed, mild or moderate severity, unspecified: Secondary | ICD-10-CM | POA: Diagnosis present

## 2021-08-20 DIAGNOSIS — F4312 Post-traumatic stress disorder, chronic: Secondary | ICD-10-CM | POA: Diagnosis present

## 2021-08-20 MED ORDER — ALUM & MAG HYDROXIDE-SIMETH 200-200-20 MG/5ML PO SUSP: 30.0000 mL | ORAL | Status: DC | PRN

## 2021-08-20 MED ORDER — DICLOFENAC SODIUM 1 % EX GEL
2.0000 g | Freq: Four times a day (QID) | CUTANEOUS | Status: DC
  Administered 2021-08-21: 2 g via TOPICAL
  Filled 2021-08-20: qty 100

## 2021-08-20 MED ORDER — LIDOCAINE 5 % EX PTCH
1.0000 | MEDICATED_PATCH | Freq: Once | CUTANEOUS | Status: AC
  Administered 2021-08-20: 1 via TRANSDERMAL
  Filled 2021-08-20: qty 1

## 2021-08-20 MED ORDER — GABAPENTIN 100 MG PO CAPS
100.0000 mg | ORAL_CAPSULE | Freq: Three times a day (TID) | ORAL | Status: DC
  Administered 2021-08-20 – 2021-08-21 (×2): 100 mg via ORAL
  Filled 2021-08-20 (×2): qty 1

## 2021-08-20 MED ORDER — HYDROXYZINE HCL 25 MG PO TABS: 25.0000 mg | ORAL_TABLET | Freq: Three times a day (TID) | ORAL | Status: DC | PRN

## 2021-08-20 MED ORDER — MAGNESIUM HYDROXIDE 400 MG/5ML PO SUSP: 30.0000 mL | Freq: Every day | ORAL | Status: DC | PRN

## 2021-08-20 MED ORDER — ARIPIPRAZOLE 10 MG PO TABS
10.0000 mg | ORAL_TABLET | Freq: Every day | ORAL | Status: DC
  Administered 2021-08-21: 10 mg via ORAL
  Filled 2021-08-20: qty 1

## 2021-08-20 MED ORDER — ARIPIPRAZOLE 5 MG PO TABS
5.0000 mg | ORAL_TABLET | Freq: Every day | ORAL | Status: DC
  Administered 2021-08-20: 5 mg via ORAL
  Filled 2021-08-20: qty 1

## 2021-08-20 MED ORDER — ACETAMINOPHEN 325 MG PO TABS
650.0000 mg | ORAL_TABLET | Freq: Four times a day (QID) | ORAL | Status: DC | PRN
  Filled 2021-08-20: qty 2

## 2021-08-20 MED ORDER — TRAZODONE HCL 50 MG PO TABS: 50.0000 mg | ORAL_TABLET | Freq: Every evening | ORAL | Status: DC | PRN

## 2021-08-20 NOTE — ED Notes (Signed)
Pt is currently speaking with the MD.

## 2021-08-20 NOTE — ED Notes (Signed)
Pt was given a beef, veggies, and juice for lunch.  

## 2021-08-20 NOTE — ED Notes (Signed)
Received patient this PM. Patient is sleeping in his bed. No acute distress noted. Patient respirations are even and unlabored. Will continue to monitor for safety.  

## 2021-08-20 NOTE — Progress Notes (Signed)
Pt is watching TV. No distress noted. Pt refused his scheduled Diclofenac Sodium and Lidocaine patch. Staff will monitor for safety.

## 2021-08-20 NOTE — Progress Notes (Signed)
Pt is awake, alert and oriented. Pt complained of generalized body ache. No signs of acute distress noted. Pt denies current SI/HI/AVH. Staff will monitor for pt's safety.

## 2021-08-20 NOTE — ED Notes (Signed)
Pt is in the bed sleeping. Respirations are even and unlabored. No acute distress noted. Will continue to monitor for safety. 
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
## 2021-08-21 ENCOUNTER — Inpatient Hospital Stay (HOSPITAL_COMMUNITY)
Admission: AD | Admit: 2021-08-21 | Discharge: 2021-08-23 | DRG: 885 | Disposition: A | Discharge status: Home / Self Care | Payer: Federal, State, Local not specified - Other | Source: Intra-hospital | Attending: Psychiatry | Admitting: Psychiatry

## 2021-08-21 ENCOUNTER — Encounter (HOSPITAL_COMMUNITY): Payer: Self-pay | Admitting: Psychiatry

## 2021-08-21 ENCOUNTER — Other Ambulatory Visit: Payer: Self-pay

## 2021-08-21 DIAGNOSIS — F449 Dissociative and conversion disorder, unspecified: Secondary | ICD-10-CM

## 2021-08-21 DIAGNOSIS — F4312 Post-traumatic stress disorder, chronic: Secondary | ICD-10-CM | POA: Diagnosis present

## 2021-08-21 DIAGNOSIS — Z9151 Personal history of suicidal behavior: Secondary | ICD-10-CM | POA: Diagnosis not present

## 2021-08-21 DIAGNOSIS — F411 Generalized anxiety disorder: Secondary | ICD-10-CM | POA: Diagnosis present

## 2021-08-21 DIAGNOSIS — F314 Bipolar disorder, current episode depressed, severe, without psychotic features: Secondary | ICD-10-CM | POA: Diagnosis not present

## 2021-08-21 DIAGNOSIS — F3130 Bipolar disorder, current episode depressed, mild or moderate severity, unspecified: Principal | ICD-10-CM | POA: Diagnosis present

## 2021-08-21 DIAGNOSIS — F1220 Cannabis dependence, uncomplicated: Secondary | ICD-10-CM | POA: Diagnosis present

## 2021-08-21 DIAGNOSIS — F909 Attention-deficit hyperactivity disorder, unspecified type: Secondary | ICD-10-CM | POA: Diagnosis present

## 2021-08-21 DIAGNOSIS — R45851 Suicidal ideations: Secondary | ICD-10-CM | POA: Diagnosis present

## 2021-08-21 DIAGNOSIS — Z87820 Personal history of traumatic brain injury: Secondary | ICD-10-CM

## 2021-08-21 LAB — LIPID PANEL
Cholesterol: 181 mg/dL (ref 0–200)
HDL: 33 mg/dL — ABNORMAL LOW (ref >40)
LDL Cholesterol: UNDETERMINED mg/dL (ref 0–99)
Total CHOL/HDL Ratio: 5.5 RATIO
Triglycerides: 478 mg/dL — ABNORMAL HIGH (ref <150)
VLDL: UNDETERMINED mg/dL (ref 0–40)

## 2021-08-21 LAB — HEMOGLOBIN A1C
Hgb A1c MFr Bld: 5.2 % (ref 4.8–5.6)
Mean Plasma Glucose: 102.54 mg/dL

## 2021-08-21 LAB — POC SARS CORONAVIRUS 2 AG: SARSCOV2ONAVIRUS 2 AG: NEGATIVE

## 2021-08-21 MED ORDER — HYDROXYZINE HCL 25 MG PO TABS
25.0000 mg | ORAL_TABLET | Freq: Three times a day (TID) | ORAL | Status: DC | PRN
  Filled 2021-08-21: qty 1

## 2021-08-21 MED ORDER — ACETAMINOPHEN 325 MG PO TABS: 650.0000 mg | ORAL_TABLET | Freq: Four times a day (QID) | ORAL | Status: DC | PRN

## 2021-08-21 MED ORDER — TRAZODONE HCL 50 MG PO TABS
50.0000 mg | ORAL_TABLET | Freq: Every evening | ORAL | Status: DC | PRN
  Filled 2021-08-21: qty 1

## 2021-08-21 MED ORDER — GABAPENTIN 100 MG PO CAPS: 100.0000 mg | ORAL_CAPSULE | Freq: Three times a day (TID) | ORAL | Status: DC

## 2021-08-21 MED ORDER — DICLOFENAC SODIUM 1 % EX GEL: 2.0000 g | Freq: Four times a day (QID) | CUTANEOUS | Status: DC

## 2021-08-21 MED ORDER — ARIPIPRAZOLE 10 MG PO TABS: 10.0000 mg | ORAL_TABLET | Freq: Every day | ORAL | Status: DC

## 2021-08-21 MED ORDER — ARIPIPRAZOLE 10 MG PO TABS
10.0000 mg | ORAL_TABLET | Freq: Every day | ORAL | Status: DC
  Administered 2021-08-22 – 2021-08-23 (×2): 10 mg via ORAL
  Filled 2021-08-21 (×3): qty 1

## 2021-08-21 MED ORDER — GABAPENTIN 100 MG PO CAPS
100.0000 mg | ORAL_CAPSULE | Freq: Three times a day (TID) | ORAL | Status: DC
  Administered 2021-08-21 – 2021-08-22 (×4): 100 mg via ORAL
  Filled 2021-08-21 (×8): qty 1

## 2021-08-21 MED ORDER — ALUM & MAG HYDROXIDE-SIMETH 200-200-20 MG/5ML PO SUSP
30.0000 mL | ORAL | Status: DC | PRN
Start: 2021-08-21 — End: 2021-08-23

## 2021-08-21 MED ORDER — MAGNESIUM HYDROXIDE 400 MG/5ML PO SUSP: 30.0000 mL | Freq: Every day | ORAL | Status: DC | PRN

## 2021-08-21 MED ORDER — DICLOFENAC SODIUM 1 % EX GEL
2.0000 g | Freq: Four times a day (QID) | CUTANEOUS | Status: DC
Start: 2021-08-21 — End: 2021-08-23
  Administered 2021-08-21 – 2021-08-23 (×7): 2 g via TOPICAL
  Filled 2021-08-21 (×3): qty 100

## 2021-08-21 NOTE — Group Note (Signed)
Date:  08/21/2021 Time:  6:10 PM  Group Topic/Focus:  Relapse Prevention Planning:   The focus of this group is to define relapse and discuss the need for planning to combat relapse. Recovery from mental health issues, including those with or without substance abuse components, is a transformative journey that requires resilience and perseverance. As individuals in an inpatient psychiatric hospital setting prepare to return to their homes and communities, it is crucial to equip them with the knowledge and tools to navigate setbacks effectively. In today's OT group, we will explore common barriers and triggers that can exacerbate setbacks, discuss strategies to identify and prevent setbacks early on, and offer coping mechanisms to overcome setbacks. Additionally, we will delve into creative approaches for individuals to overcome setbacks once they have experienced them, fostering a sense of empowerment and long-term recovery.    Participation Level:  Active  Participation Quality:  Appropriate  Affect:  Appropriate  Cognitive:  Appropriate  Insight: Appropriate  Engagement in Group:  Engaged  Modes of Intervention:  Discussion and Education  Additional Comments:  active and engaged   Brantley Stage 08/21/2021, 6:10 PM Cornell Barman, OT

## 2021-08-21 NOTE — Plan of Care (Signed)
  Problem: Education: Goal: Emotional status will improve Outcome: Not Progressing Goal: Mental status will improve Outcome: Not Progressing Goal: Verbalization of understanding the information provided will improve Outcome: Not Progressing   

## 2021-08-21 NOTE — Discharge Instructions (Addendum)
Patient accepted to [facility] adult unit.  Accepting attending Dr. Sherron Flemings.

## 2021-08-21 NOTE — ED Notes (Signed)
Patient in his bed sleeping. Patient respirations are even and unlabored. Will continue to monitor for safety.  
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
## 2021-08-21 NOTE — [facility] Group Notes (Signed)
Spiritual care group on grief and loss facilitated by chaplain Rashad Auld, MDiv   Group Goal:  Support / Education around grief and loss   Members engage in facilitated group support and psycho-social education.   Group Description:   Following introductions and group rules, group members engaged in facilitated group dialog and support around topic of loss, with particular support around experiences of loss in their lives. Group Identified types of loss (relationships / self / things) and identified patterns, circumstances, and changes that precipitate losses. Reflected on thoughts / feelings around loss, normalized grief responses, and recognized variety in grief experience. Group noted Worden's four tasks of grief in discussion.   Group drew on Adlerian / Rogerian, narrative, MI   Patient Progress:  Patient did not attend group. 

## 2021-08-21 NOTE — Progress Notes (Addendum)
  On assessment, pt presents with mild anxiety and depression (1.5/10).  Pt observed in day room interacting with peers. Pt denies SI/HI/AVH, and verbally contracts for safety. Pt reports experiencing AH (snapping fingers in his ears) and VH (seeing an ant that wasn't there) on yesterday, but none today.  Medication was administered.  Pt provided emotional support.  Pt is safe on the unit with Q 15 minute safety checks.     08/21/21 2122  Psych Admission Type (Psych Patients Only)  Admission Status Voluntary  Psychosocial Assessment  Patient Complaints Anxiety;Depression;Self-harm behaviors  Eye Contact Fair  Facial Expression Anxious  Affect Appropriate to circumstance  Speech Logical/coherent  Interaction Assertive  Motor Activity Fidgety  Appearance/Hygiene Unremarkable  Behavior Characteristics Cooperative  Mood Depressed;Anxious  Thought Process  Coherency WDL  Content WDL  Delusions WDL  Perception WDL  Hallucination None reported or observed  Judgment Impaired  Confusion None  Danger to Self  Current suicidal ideation? Denies  Agreement Not to Harm Self Yes  Description of Agreement verbally contracted for safety  Danger to Others  Danger to Others None reported or observed

## 2021-08-21 NOTE — Progress Notes (Signed)
B[facility]/BMU LCSW Progress Note   08/21/2021    10:52 AM  Devon Lopez   [id number]   Type of Contact and Topic:  Psychiatric Bed Placement   Pt accepted to [facility] 407-2    Patient meets inpatient criteria per Karsten Ro, MD    The attending provider will be [place], MD   Call report to [phone]    Malva Limes, RN @ [facility] notified.     Pt scheduled  to arrive at [facility] TODAY.    Damita Dunnings, MSW, LCSW-A  10:53 AM 08/21/2021

## 2021-08-21 NOTE — ED Notes (Signed)
Pt transferred to [facility]. All belongings sent w safe driver.

## 2021-08-21 NOTE — ED Provider Notes (Signed)
FBC/OBS ASAP Discharge Summary  Date and Time: 08/21/2021 10:48 AM  Name: Devon Lopez  MRN:  [id number]   Discharge Diagnoses:  Final diagnoses:  Bipolar 2 disorder, major depressive episode (HCC)  Chronic post-traumatic stress disorder (PTSD)  Dissociative amnesia with dissociative fugue (HCC)  Delta-9-tetrahydrocannabinol (THC) dependence (HCC)  GAD (generalized anxiety disorder)    Subjective: On my evaluation, patient was initially seen sleeping comfortably, awoken easily.  Pt states "it is too early to tell" how he is feeling today.  His affect appears to be in full range. He slept well last night and reports stable appetite.  He denies any SI, HI today.  He reports auditory hallucinations and last heard somebody snapping fingers in his ears this morning.  He denies any visual hallucinations today but reports that he was seeing ants yesterday. When he initially came,  he was seeing bugs, spiders crawling over him and was hearing voices that were implying him to hurt himself. He reports he was hearing " your girlfriend will be better off without you".  He reports history of bipolar disorder and had been on Lamictal which did not help, Seroquel which he did not like due to side effects.  He was also on Adderall for ADHD.  He has been off medications for many months due to loss of his insurance.  He was started on Abilify yesterday.  He denies any side effects to Abilify.  He reports physical symptoms of lower back pain today and rates it 8/10.  He uses delta 8 regularly.  Discussed negative effects of delta 8 including psychosis, depression and anxiety. He verbalizes understanding.  He also has a history of traumatic brain injury and he injured his spine at the age of 89 due to a skiing accident. He reports dissociation episodes which usually lasts for few hours.    Stay Summary:  Devon Lopez, 21 y.o. male, with psychiatric history of possible bipolar 2 disorder, MDD, GAD, chronic PTSD,  TBI, synthetic THC use, past suicide attempts x2, presented voluntary with partner from [facility] to [facility] (08/21/2021) for unconscious self-injurious behavior in the setting of worsening dissociative amnesia with dissociative fugue.   Total Time spent with patient: 30 minutes  Past Psychiatric History: Pt reports he has been diagnosed with bipolar disorder, PTSD, and ADHD. He says he is not taking any psychiatric medications because he has not been able to see a psychiatrist recently. He also reports a history of TBI which was the result of a skiing injury at age 21. Pt was admitted to the observation unit at [facility]. Pt was started on Abilify for mood stabilization and psychosis which was titrated to 10 mg daily and gabapentin 100 mg 3 times daily for GAD and chronic pain. Pt was reevaluated this morning. Today, he denied SI, HI but still having auditory hallucinations.  He last had visual hallucinations yesterday.  Denied any side effects from medications.  Patient recommended for inpatient psychiatric hospitalization.  Patient accepted to [facility] this morning.  Accepting attending Dr. Sherron Flemings.  Patient will be transferred via safe transport.  Past Medical History:  Past Medical History:  Diagnosis Date   Anxiety    Bipolar 2 disorder (HCC)    Depression    PTSD (post-traumatic stress disorder)    Suicide attempt (HCC)    TBI (traumatic brain injury) (HCC) 2016   No past surgical history on file. Family History: No family history on file. Family Psychiatric History: No family history on  file. Social History:  Social History   Substance and Sexual Activity  Alcohol Use Never     Social History   Substance and Sexual Activity  Drug Use Yes   Types: Marijuana    Social History   Socioeconomic History   Marital status: Single    Spouse name: Not on file   Number of children: Not on file   Years of education: Not on file   Highest education level: Not on file   Occupational History   Not on file  Tobacco Use   Smoking status: Never   Smokeless tobacco: Never  Vaping Use   Vaping Use: Never used  Substance and Sexual Activity   Alcohol use: Never   Drug use: Yes    Types: Marijuana   Sexual activity: Not on file  Other Topics Concern   Not on file  Social History Narrative   Not on file   Social Determinants of Health   Financial Resource Strain: Not on file  Food Insecurity: Not on file  Transportation Needs: Not on file  Physical Activity: Not on file  Stress: Not on file  Social Connections: Not on file   SDOH:  SDOH Screenings   Alcohol Screen: Not on file  Depression (PHQ2-9): Not on file  Financial Resource Strain: Not on file  Food Insecurity: Not on file  Housing: Not on file  Physical Activity: Not on file  Social Connections: Not on file  Stress: Not on file  Tobacco Use: Low Risk  (08/19/2021)   Patient History    Smoking Tobacco Use: Never    Smokeless Tobacco Use: Never    Passive Exposure: Not on file  Transportation Needs: Not on file    Tobacco Cessation:  N/A, patient does not currently use tobacco products  Current Medications:  Current Facility-Administered Medications  Medication Dose Route Frequency Provider Last Rate Last Admin   acetaminophen (TYLENOL) tablet 650 mg  650 mg Oral Q6H PRN Bobbitt, Shalon E, NP       alum & mag hydroxide-simeth (MAALOX/MYLANTA) 200-200-20 MG/5ML suspension 30 mL  30 mL Oral Q4H PRN Bobbitt, Shalon E, NP       ARIPiprazole (ABILIFY) tablet 10 mg  10 mg Oral Daily Princess Bruins, DO   10 mg at 08/21/21 9518   diclofenac Sodium (VOLTAREN) 1 % topical gel 2 g  2 g Topical QID Princess Bruins, DO   2 g at 08/21/21 0835   gabapentin (NEURONTIN) capsule 100 mg  100 mg Oral TID Princess Bruins, DO   100 mg at 08/21/21 8416   hydrOXYzine (ATARAX) tablet 25 mg  25 mg Oral TID PRN Bobbitt, Shalon E, NP       magnesium hydroxide (MILK OF MAGNESIA) suspension 30 mL  30 mL Oral Daily  PRN Bobbitt, Shalon E, NP       traZODone (DESYREL) tablet 50 mg  50 mg Oral QHS PRN Bobbitt, Shalon E, NP       Current Outpatient Medications  Medication Sig Dispense Refill   EPINEPHrine (EPIPEN 2-PAK) 0.3 mg/0.3 mL IJ SOAJ injection Inject 0.3 mLs (0.3 mg total) into the muscle as needed for anaphylaxis. 1 each 0    PTA Medications: (Not in a hospital admission)       No data to display          Flowsheet Row ED from 08/19/2021 in [facility] Most recent reading at 08/19/2021 11:50 PM ED from 08/19/2021 in [facility]  EMERGENCY DEPARTMENT Most recent reading at 08/19/2021  8:34 PM ED from 07/21/2020 in [facility] EM[facility]PART[facility] Most recent reading at 07/21/2020  9:00 AM  C-SSRS RISK CATEGORY High Risk Low Risk No Risk       Musculoskeletal  Strength & Muscle Tone: within normal limits Gait & Station: normal Patient leans: N/A  Psychiatric Specialty Exam  Presentation  General Appearance: Appropriate for Environment; Casual  Eye Contact:Good  Speech:Clear and Coherent; Normal Rate  Speech Volume:Normal  Handedness:Right   Mood and Affect  Mood:-- (its too early to tell)  Affect:Full Range   Thought Process  Thought Processes:Coherent; Goal Directed  Descriptions of Associations:Intact  Orientation:Full (Time, Place and Person)  Thought Content:Logical  Diagnosis of Schizophrenia or Schizoaffective disorder in past: No    Hallucinations:Hallucinations: Auditory Description of Auditory Hallucinations: Heard somebody snapping his fingers in his ears this morning Description of Visual Hallucinations: Denied today, saw ant yesterday  Ideas of Reference:None  Suicidal Thoughts:Suicidal Thoughts: No SI Active Intent and/or Plan: -- (Denies) SI Passive Intent and/or Plan: -- (Denies)  Homicidal Thoughts:Homicidal Thoughts: No   Sensorium  Memory:Immediate Good; Recent Fair; Remote Fair (Missing  intervals of time)  Judgment:Fair  Insight:Shallow   Executive Functions  Concentration:Good  Attention Span:Good  Recall:Good  Fund of Knowledge:Good  Language:Good   Psychomotor Activity  Psychomotor Activity:Psychomotor Activity: Normal   Assets  Assets:Communication Skills; Desire for Improvement; Financial Resources/Insurance; Housing; Intimacy; Resilience; Social Support; Transportation   Sleep  Sleep:Sleep: Good Number of Hours of Sleep: -1   Nutritional Assessment (For OBS and FBC admissions only) Has the patient had a weight loss or gain of 10 pounds or more in the last 3 months?: No Has the patient had a decrease in food intake/or appetite?: No Does the patient have dental problems?: No Does the patient have eating habits or behaviors that may be indicators of an eating disorder including binging or inducing vomiting?: No Has the patient recently lost weight without trying?: 0 Has the patient been eating poorly because of a decreased appetite?: 0 Malnutrition Screening Tool Score: 0    Physical Exam  Physical Exam Vitals and nursing note reviewed.  HENT:     Head: Normocephalic and atraumatic.  Pulmonary:     Effort: Pulmonary effort is normal. No respiratory distress.  Neurological:     General: No focal deficit present.     Mental Status: He is alert and oriented to person, place, and time.  Review of Systems  Constitutional:  Negative for chills and fever.  Respiratory:  Negative for cough and shortness of breath.   Cardiovascular:  Negative for chest pain.  Gastrointestinal:  Negative for abdominal pain, diarrhea, nausea and vomiting.  Musculoskeletal:  Positive for back pain.  Neurological:  Negative for dizziness and headaches.  Psychiatric/Behavioral:  Positive for hallucinations and substance abuse. Negative for suicidal ideas. The patient is nervous/anxious. The patient does not have insomnia.     Blood pressure (!) 158/80, pulse 66,  temperature (!) 97.4 F (36.3 C), temperature source Oral, resp. rate 20, SpO2 98 %. There is no height or weight on file to calculate BMI.  Demographic Factors:  Male and Unemployed  Loss Factors: NA  Historical Factors: Prior suicide attempts  Risk Reduction Factors:   Living with another person, especially a relative, Positive social support, and Positive coping skills or problem solving skills  Continued Clinical Symptoms:  Bipolar Disorder:   Bipolar II More than one psychiatric diagnosis Previous Psychiatric Diagnoses and  Treatments Medical Diagnoses and Treatments/Surgeries  Cognitive Features That Contribute To Risk:  Closed-mindedness and Thought constriction (tunnel vision)    Suicide Risk:  Moderate:  Frequent suicidal ideation with limited intensity, and duration, some specificity in terms of plans, no associated intent, good self-control, limited dysphoria/symptomatology, some risk factors present, and identifiable protective factors, including available and accessible social support.  Plan Of Care/Follow-up recommendations:  Activity:  As Tolerated Diet:  Regular  Disposition: Pt accepted to [facility]  (407-2 ). Accepting attending Dr. Sherron Flemings. Pt Will be transferred via safe transport.  A/P: Unconscious self-injurious behavior 2/2 worsening dissociative amnesia with dissociative fugue in the setting of chronic PTSD Patient is concerned that he is unable to be safe at home due to dissociative episodes and is seeking treatment, amenable to psych hospital.  Pt accepted to [facility]  (407-2 ). Accepting attending Dr. Sherron Flemings. Labs-UDS positive for THC, respiratory panel negative, TSH WNL, CBC and CMP WNL except glucose 130, ethanol less than 10.  EKG-QTc 381,Normal sinus rhythm with sinus arrhythmia   Bipolar 2 d/o - current depression vs MDD with comorbid synthetic THC use Denies SI, HI . Reports AH this morning. Denies VH today but reports seeing ants yesterday   Continue Abilify 10 mg for mood stabilization and psychosis Consider SSRI by outpatient provider once on effective dose for abilify Instructed to dc thc   GAD Back pain due to spinal injury Started gabapentin 100 mg TID Lidocaine patch Voltaren gel    Karsten Ro, MD 08/21/2021, 10:48 AM

## 2021-08-21 NOTE — Group Note (Signed)
LCSW Group Therapy Note   Group Date: 08/21/2021 Start Time: 1300 End Time: 1400  Type of Therapy/Topic: Group Therapy: Emotions and The Body   Participation Level: Active  Description of Group: This group will allow patients to explore how their thoughts and feelings connect with their physical health. Patients will be encouraged to think more deeply about where they begin to feel emotions within their bodies and how those emotions can affect their behaviors. The Pt will be given a worksheet to help them identify emotions and another worksheet to color in where they feel both positive and negative emotions in their body. This group will be process-oriented, with patients participating in exploration of their own experiences, giving and receiving support, and processing challenge from other group members.  Therapeutic Goals: 1. Patient will demonstrate understanding of emotions and their own bodies.  2. Patient will be able to express two feelings (a negative emotion and positive emotion) that they have felt prior to hospitalization. 3. Patient will be able to express where they feel emotions in their body and show that feeling on their worksheet by using an identifying color.  4. Patient will demonstrate their ability to communicate their needs through discussion and/or role play  Summary of Patient Progress:  The Pt attended the group and remained there the entire time.  The Pt accepted all worksheets and followed along throughout the group session.  The Pt discussed emotions openly and demonstrated understanding of how emotions can affect physical health.  The Pt was appropriate with their peers and participated in all group discussions.    Therapeutic Modalities: Cognitive Behavioral Therapy Brief Therapy Feelings Identification  Aram Beecham, LCSWA 08/21/2021  2:15 PM

## 2021-08-22 DIAGNOSIS — F909 Attention-deficit hyperactivity disorder, unspecified type: Secondary | ICD-10-CM

## 2021-08-22 DIAGNOSIS — F449 Dissociative and conversion disorder, unspecified: Secondary | ICD-10-CM

## 2021-08-22 LAB — LDL CHOLESTEROL, DIRECT: Direct LDL: 94 mg/dL (ref 0–99)

## 2021-08-22 MED ORDER — GABAPENTIN 100 MG PO CAPS
200.0000 mg | ORAL_CAPSULE | Freq: Three times a day (TID) | ORAL | Status: DC
  Administered 2021-08-22 – 2021-08-23 (×2): 200 mg via ORAL
  Filled 2021-08-22 (×5): qty 2

## 2021-08-22 NOTE — H&P (Addendum)
Psychiatric Admission Assessment Adult  Patient Identification: Devon Lopez MRN:  [id number] Date of Evaluation:  08/22/2021 Chief Complaint:  Bipolar affective disorder, current episode depressed ([place]) [F31.30] Principal Diagnosis: Bipolar affective disorder, current episode depressed ([place]) Diagnosis:  Principal Problem:   Bipolar affective disorder, current episode depressed ([place]) Active Problems:   Chronic post-traumatic stress disorder (PTSD)   Delta-9-tetrahydrocannabinol (THC) dependence ([place])   GAD (generalized anxiety disorder)   ADHD   Dissociative episodes  History of Present Illness:  Patient is a 21 year old male with a reported psychiatric history of bipolar disorder, GAD, ADHD, PTSD" who was admitted from the [facility] for evaluation suicidal thoughts, auditory hallucinations, visual hallucinations, and recent dissociative episodes.  He is medically cleared by the [facility] prior to admission to this hospital.  Home Psychiatric medications: None  At the [facility], the patient was started on Abilify 5 mg once daily, which was titrated to 10 mg once daily for mood stabilization; and also started on gabapentin 100 mg 3 times daily for neuropathic pain (which was what the patient was self-medicating with delta 8).  The patient reports that since starting Abilify, and being transferred to the psychiatric hospital, his mood is much improved.  He reports feeling less depressed and less dysphoric.  He reports the auditory hallucinations have become less intense, less frequent, and less bothersome.  He reports most recent auditory hallucination was not voices but instead like snapping of fingers.  Denies VH.  Denies paranoid symptoms, thought insertion, thought control, or ideas of reference. He reports that sleep is some better, with less anxiety, less bothered by auditory hallucinations, and less head and neck pain since starting gabapentin. Reports appetite is okay, energy level is okay, and the  concentration has improved.  At this time he denies having any suicidal thoughts, and reports that suicidal thoughts have decreased in frequency and severity since initial admission to the [facility].  He denies having any HI. He reports that anxiety level is improved over the last 3 to 4 days.  Denies having any panic attack. He denies having any dissociative episodes in the last 4 days, since initial presentation to the [facility], we discussed that this could be due to him not using the delta 8, and or starting psychiatric medication for underlying psychiatric illness. As noted in the initial assessment at the [facility], patient does have a history of trauma.   Past psychiatric history: Bipolar disorder, GAD, ADHD, PTSD Patient reports 1 hospitalization in the past Patient reports 1 suicide attempt in the past, age 20, drank bleach Home psychiatric medications: None Past psychiatric medication history: Seroquel, lamotrigine, Adderall, lithium  Past medical history: Patient had a ski accident, neck pain since that time.  Reports gabapentin is helping.  Patient reports previously self-medicating this pain with delta 8 Denies seizure history Denies surgical history NKDA  Family history: Cousin diagnosed with schizophrenia.  Family suicide history: Cousin was diagnosed with cranial committed suicide  Social history: Born and raised in [place].  Moved to [place] around the age of 79.  Has girlfriend.  No children.  Unemployed.  Substance use: Uses delta a daily.  Rare alcohol use.  Denies nicotine or tobacco use.  Denies recent marijuana use.  Denies other illicit drug use.    Total Time spent with patient: 30 minutes    Is the patient at risk to self? Yes.   Pt initially presented to [facility] with suicidal thoughts per records. Has the patient been a risk to self in the past 6  months? Yes.    Has the patient been a risk to self within the distant past? Yes.    Is the patient a risk to others? No.  Has  the patient been a risk to others in the past 6 months? No.  Has the patient been a risk to others within the distant past? No.   Prior Inpatient Therapy:   Prior Outpatient Therapy:    Alcohol Screening: 1. How often do you have a drink containing alcohol?: Never 2. How many drinks containing alcohol do you have on a typical day when you are drinking?: 1 or 2 3. How often do you have six or more drinks on one occasion?: Less than monthly AUDIT-C Score: 1 Substance Abuse History in the last 12 months:  Yes.   Consequences of Substance Abuse: Medical Consequences:  psychiatric Previous Psychotropic Medications: Yes  Psychological Evaluations: Yes  Past Medical History:  Past Medical History:  Diagnosis Date   Anxiety    Bipolar 2 disorder ([place])    Depression    PTSD (post-traumatic stress disorder)    Suicide attempt ([place])    TBI (traumatic brain injury) ([place]) 2016   History reviewed. No pertinent surgical history. Family History: History reviewed. No pertinent family history.  Tobacco Screening:   denies   Social History:  Social History   Substance and Sexual Activity  Alcohol Use Never     Social History   Substance and Sexual Activity  Drug Use Yes   Types: Marijuana    Additional Social History:                           Allergies:  No Known Allergies Lab Results:  Results for orders placed or performed during the hospital encounter of 08/21/21 (from the past 48 hour(s))  Lipid panel     Status: Abnormal   Collection Time: 08/21/21  6:29 PM  Result Value Ref Range   Cholesterol 181 0 - 200 mg/dL   Triglycerides 478 (H) <150 mg/dL   HDL 33 (L) >40 mg/dL   Total CHOL/HDL Ratio 5.5 RATIO   VLDL UNABLE TO CALCULATE IF TRIGLYCERIDE OVER 400 mg/dL 0 - 40 mg/dL   LDL Cholesterol UNABLE TO CALCULATE IF TRIGLYCERIDE OVER 400 mg/dL 0 - 99 mg/dL    Comment:        Total Cholesterol/HDL:CHD Risk Coronary Heart Disease Risk Table                     Men    Women  1/2 Average Risk   3.4   3.3  Average Risk       5.0   4.4  2 X Average Risk   9.6   7.1  3 X Average Risk  23.4   11.0        Use the calculated Patient Ratio above and the CHD Risk Table to determine the patient's CHD Risk.        ATP III CLASSIFICATION (LDL):  <100     mg/dL   Optimal  100-129  mg/dL   Near or Above                    Optimal  130-159  mg/dL   Borderline  160-189  mg/dL   High  >190     mg/dL   Very High Performed at [facility] [address], [place],  [address]   Hemoglobin  A1c     Status: None   Collection Time: 08/21/21  6:29 PM  Result Value Ref Range   Hgb A1c MFr Bld 5.2 4.8 - 5.6 %    Comment: (NOTE) Pre diabetes:          5.7%-6.4%  Diabetes:              >6.4%  Glycemic control for   <7.0% adults with diabetes    Mean Plasma Glucose 102.54 mg/dL    Comment: Performed at [facility] [address], [place], [place] [address]  LDL cholesterol, direct     Status: None   Collection Time: 08/21/21  6:29 PM  Result Value Ref Range   Direct LDL 94.0 0 - 99 mg/dL    Comment: Performed at [facility] [address], [place], [place] [address]    Blood Alcohol level:  Lab Results  Component Value Date   ETH <10 [id number]    Metabolic Disorder Labs:  Lab Results  Component Value Date   HGBA1C 5.2 08/21/2021   MPG 102.54 08/21/2021   No results found for: "PROLACTIN" Lab Results  Component Value Date   CHOL 181 08/21/2021   TRIG 478 (H) 08/21/2021   HDL 33 (L) 08/21/2021   CHOLHDL 5.5 08/21/2021   VLDL UNABLE TO CALCULATE IF TRIGLYCERIDE OVER 400 mg/dL 08/21/2021   LDLCALC UNABLE TO CALCULATE IF TRIGLYCERIDE OVER 400 mg/dL 08/21/2021    Current Medications: Current Facility-Administered Medications  Medication Dose Route Frequency Provider Last Rate Last Admin   acetaminophen (TYLENOL) tablet 650 mg  650 mg Oral Q6H PRN Armando Reichert, MD       alum & mag hydroxide-simeth  (MAALOX/MYLANTA) 200-200-20 MG/5ML suspension 30 mL  30 mL Oral Q4H PRN Rosita Kea, Vandana, MD       ARIPiprazole (ABILIFY) tablet 10 mg  10 mg Oral Daily Armando Reichert, MD   10 mg at 08/22/21 0751   diclofenac Sodium (VOLTAREN) 1 % topical gel 2 g  2 g Topical QID Armando Reichert, MD   2 g at 08/22/21 0750   gabapentin (NEURONTIN) capsule 200 mg  200 mg Oral TID Janine Limbo, MD       hydrOXYzine (ATARAX) tablet 25 mg  25 mg Oral TID PRN Armando Reichert, MD       magnesium hydroxide (MILK OF MAGNESIA) suspension 30 mL  30 mL Oral Daily PRN Armando Reichert, MD       traZODone (DESYREL) tablet 50 mg  50 mg Oral QHS PRN Armando Reichert, MD       PTA Medications: Medications Prior to Admission  Medication Sig Dispense Refill Last Dose   ARIPiprazole (ABILIFY) 10 MG tablet Take 1 tablet (10 mg total) by mouth daily.      diclofenac Sodium (VOLTAREN) 1 % GEL Apply 2 g topically 4 (four) times daily.      gabapentin (NEURONTIN) 100 MG capsule Take 1 capsule (100 mg total) by mouth 3 (three) times daily.       Musculoskeletal: Strength & Muscle Tone: within normal limits Gait & Station: normal Patient leans: N/A            Psychiatric Specialty Exam:  Presentation  General Appearance: Casual  Eye Contact:Good  Speech:Normal Rate  Speech Volume:Normal  Handedness:Right   Mood and Affect  Mood:Anxious  Affect:Congruent   Thought Process  Thought Processes:Linear  Duration of Psychotic Symptoms: Less than six months  Past Diagnosis of Schizophrenia or Psychoactive disorder: No  Descriptions of Associations:Intact  Orientation:Full (Time, Place and Person)  Thought Content:Logical  Hallucinations:Hallucinations: Auditory Description of Auditory Hallucinations: like snapping of fingers Description of Visual Hallucinations: Denied today, saw ant yesterday  Ideas of Reference:None  Suicidal Thoughts:Suicidal Thoughts: No SI Active Intent and/or Plan: -- (Denies) SI  Passive Intent and/or Plan: -- (Denies)  Homicidal Thoughts:Homicidal Thoughts: No   Sensorium  Memory:Immediate Good; Recent Good; Remote Good  Judgment:Fair  Insight:Fair   Executive Functions  Concentration:Fair  Attention Span:Fair  [place] of Knowledge:Good  Language:Good   Psychomotor Activity  Psychomotor Activity:Psychomotor Activity: Normal   Assets  Assets:Communication Skills; Desire for Improvement; Financial Resources/Insurance; Housing; Intimacy; Resilience; Social Support; Transportation   Sleep  Sleep:Sleep: Fair    Physical Exam: Physical Exam Vitals reviewed.  Constitutional:      General: He is not in acute distress.    Appearance: He is not toxic-appearing.  Pulmonary:     Effort: Pulmonary effort is normal.  Neurological:     Mental Status: He is alert.     Motor: No weakness.     Gait: Gait normal.  Psychiatric:        Mood and Affect: Mood normal.    Review of Systems  Constitutional:  Negative for chills and fever.  Cardiovascular:  Negative for chest pain and palpitations.  Neurological:  Negative for dizziness, tingling, tremors and headaches.  Psychiatric/Behavioral:  Positive for hallucinations and substance abuse. Negative for depression and suicidal ideas. The patient is nervous/anxious. The patient does not have insomnia.    Blood pressure 128/90, pulse 89, temperature 97.7 F (36.5 C), temperature source Oral, resp. rate 16, height 6' (1.829 m), weight 87.1 kg, SpO2 98 %. Body mass index is 26.04 kg/m.  Treatment Plan Summary: Daily contact with patient to assess and evaluate symptoms and progress in treatment  ASSESSMENT:  Diagnoses / Active Problems: -Bipolar disorder, type II, current episode is depressed -GAD -PTSD -Reported history of ADHD -Delta 8 use -Reported history of TBI  PLAN: Safety and Monitoring:  --  Voluntary admission to inpatient psychiatric unit for safety, stabilization and  treatment  -- Daily contact with patient to assess and evaluate symptoms and progress in treatment  -- Patient's case to be discussed in multi-disciplinary team meeting  -- Observation Level : q15 minute checks  -- Vital signs:  q12 hours  -- Precautions: suicide, elopement, and assault  2. Psychiatric Diagnoses and Treatment:    -Continue Abilify 10 mg once daily -Increase gabapentin from 100 mg to 200 mg 3 times daily for neuropathic pain, mood stabilization and anxiety -Counseled on the importance of cessation from illicit substance use including delta 8 use, as this likely destabilizes his Axis I symptoms including dissociative symptoms  --  The risks/benefits/side-effects/alternatives to this medication were discussed in detail with the patient and time was given for questions. The patient consents to medication trial.    -- Metabolic profile and EKG monitoring obtained while on an atypical antipsychotic (BMI: Lipid Panel: HbgA1c: QTc:)   -- Encouraged patient to participate in unit milieu and in scheduled group therapies   -- Short Term Goals: Ability to identify changes in lifestyle to reduce recurrence of condition will improve, Ability to verbalize feelings will improve, Ability to disclose and discuss suicidal ideas, Ability to demonstrate self-control will improve, Ability to identify and develop effective coping behaviors will improve, Ability to maintain clinical measurements within normal limits will improve, Compliance with prescribed medications will improve, and Ability to identify triggers  associated with substance abuse/mental health issues will improve  -- Long Term Goals: Improvement in symptoms so as ready for discharge    3. Medical Issues Being Addressed:   Tobacco Use Disorder  -- Nicotine patch 21mg /24 hours ordered  -- Smoking cessation encouraged  4. Discharge Planning:   -- Social work and case management to assist with discharge planning and identification of  hospital follow-up needs prior to discharge  -- Estimated LOS: 5-7 days  -- Discharge Concerns: Need to establish a safety plan; Medication compliance and effectiveness  -- Discharge Goals: Return home with outpatient referrals for mental health follow-up including medication management/psychotherapy   Observation Level/Precautions:  15 minute checks  Laboratory: See above  Psychotherapy:    Medications:    Consultations:    Discharge Concerns:    Estimated LOS: 3 to 5 days  Other:     Physician Treatment Plan for Primary Diagnosis: Bipolar affective disorder, current episode depressed [facility])  Physician Treatment Plan for Secondary Diagnosis: Principal Problem:   Bipolar affective disorder, current episode depressed ([place]) Active Problems:   Chronic post-traumatic stress disorder (PTSD)   Delta-9-tetrahydrocannabinol (THC) dependence ([place])   GAD (generalized anxiety disorder)   ADHD   Dissociative episodes   I certify that inpatient services furnished can reasonably be expected to improve the patient's condition.    Christoper Allegra, MD 6/13/202312:40 PM  Total Time Spent in Direct Patient Care:  I personally spent 60 minutes on the unit in direct patient care. The direct patient care time included face-to-face time with the patient, reviewing the patient's chart, communicating with other professionals, and coordinating care. Greater than 50% of this time was spent in counseling or coordinating care with the patient regarding goals of hospitalization, psycho-education, and discharge planning needs.   Janine Limbo, MD Psychiatrist

## 2021-08-22 NOTE — Progress Notes (Signed)
   08/22/21 0822  Psych Admission Type (Psych Patients Only)  Admission Status Voluntary  Psychosocial Assessment  Patient Complaints Anxiety  Eye Contact Fair  Facial Expression Anxious  Affect Appropriate to circumstance  Speech Logical/coherent  Interaction Assertive  Motor Activity Fidgety  Appearance/Hygiene Unremarkable  Behavior Characteristics Cooperative  Mood Depressed;Anxious  Thought Process  Coherency WDL  Content WDL  Delusions WDL  Perception WDL  Hallucination None reported or observed  Judgment Poor  Confusion None  Danger to Self  Current suicidal ideation? Denies  Agreement Not to Harm Self Yes  Description of Agreement Verbally contracts for safety  Danger to Others  Danger to Others None reported or observed

## 2021-08-22 NOTE — Progress Notes (Signed)
The patient rated his day as a 6 or 7 since the chairs on the unit made him feel uncomfortable. He states that he has a long history of back issues. He had a good visitation with his family member this evening. He achieved his goal for today which was to review his discharge plans.

## 2021-08-22 NOTE — [facility] Suicide Risk Assessment (Signed)
[place] INPATIENT:  Family/Significant Other Suicide Prevention Education  Suicide Prevention Education:  Education Completed; Devon Lopez [phone] (Girlfriend) has been identified by the patient as the family member/significant other with whom the patient will be residing, and identified as the person(s) who will aid the patient in the event of a mental health crisis (suicidal ideations/suicide attempt).  With written consent from the patient, the family member/significant other has been provided the following suicide prevention education, prior to the and/or following the discharge of the patient.  The suicide prevention education provided includes the following: Suicide risk factors Suicide prevention and interventions National Suicide Hotline telephone number [facility] assessment telephone number [facility] Emergency Assistance [place] and/or Residential Mobile Crisis Unit telephone number  Request made of family/significant other to: Remove weapons (e.g., guns, rifles, knives), all items previously/currently identified as safety concern.   Remove drugs/medications (over-the-counter, prescriptions, illicit drugs), all items previously/currently identified as a safety concern.  The family member/significant other verbalizes understanding of the suicide prevention education information provided.  The family member/significant other agrees to remove the items of safety concern listed above.  CSW spoke with Devon Lopez who states that her boyfriend came to the hospital because he was afraid that he may hurt himself.  She states that he cut himself approximately 1 week ago and states that he does not remember doing it.  She states that he also harmed himself 1 year ago in a similar manner.  She states that her boyfriend often tells her about his depression and anxiety about not being able to work due to his physical and mental health.  Devon Lopez confirms  that her boyfriend lives with her and that he can return to the home after discharge.  She states that there are no firearms or weapons in the home.  CSW completed SPE with Devon Lopez.   Devon Lopez 08/22/2021, 10:25 AM

## 2021-08-22 NOTE — Group Note (Signed)
Recreation Therapy Group Note   Group Topic:Animal Assisted Therapy   Group Date: 08/22/2021 Start Time: 1430 End Time: 1500 Facilitators: Caroll Rancher, LRT,CTRS Location: [address] Dayroom   Animal-Assisted Activity (AAA) Program Checklist/Progress Notes Patient Eligibility Criteria Checklist & Daily Group note for Rec Tx Intervention  AAA/T Program Assumption of Risk Form signed by Patient/ or Parent Legal Guardian Yes  Patient is free of allergies or severe asthma Yes  Patient reports no fear of animals Yes  Patient reports no history of cruelty to animals Yes  Patient understands his/her participation is voluntary Yes  Patient washes hands before animal contact Yes  Patient washes hands after animal contact Yes   Affect/Mood: Appropriate   Participation Level: Engaged    Clinical Observations/Individualized Feedback:  Pt attended and participated in group session with therapy dog.  Pt engaged with therapy dog and listened while peers shared their experiences with their pets at home.    Plan: Continue to engage patient in RT group sessions 2-3x/week.   Caroll Rancher, LRT,CTRS  08/22/2021 4:05 PM

## 2021-08-22 NOTE — Plan of Care (Addendum)
Patient stayed in the milieu and remained cooperative. Attended group and was active. Received bedtime medications. Currently in bed resting. Safety precautions maintained.

## 2021-08-22 NOTE — [facility] Counselor (Signed)
Adult Comprehensive Assessment  Patient ID: Devon Lopez, male   DOB: Jan 28, 2001, 21 y.o.   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 Employment / Job issues: Pt reports working part-time at [employer], Reports difficulties working due to physical health Family Relationships: Pt reports he does not speak with his 2 brothers that live out of [place] / Lack of resources (include bankruptcy): Pt reports financial stressors from not being able to work Housing / Lack of housing: Pt reports living with his girlfriend Physical health (include injuries & life threatening diseases): 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 Social relationships: Pt reports no stressors Substance abuse: Pt reports using Delta8 3 times a day and Alcohol 2 times a month Bereavement / Loss: Pt reports no stressors  Living/Environment/Situation:  Living Arrangements: Spouse/significant other Living conditions (as described by patient or guardian): Girlfriend's House Who else lives in the home?: Girlfriend How long has patient lived in current situation?: 1 year What is atmosphere in current home: Comfortable, Supportive  Family History:  Marital status: Long term relationship Long term relationship, how long?: 3 years What types of issues is patient dealing with in the relationship?: None reported Are you sexually active?: Yes What is your sexual orientation?: Demi-Sexual Has your sexual activity been affected by drugs, alcohol, medication, or emotional stress?:  No Does patient have children?: No  Childhood History:  By whom was/is the patient raised?: Mother Additional childhood history information: Pt reports having visitation with his father as a child Description of patient's relationship with caregiver when they were a child: "I got along pretty well with my mother" Patient's description of current relationship with people who raised him/her: "I am quite close with my mother" How were you disciplined when you got in trouble as a child/adolescent?: Spankings and groundings Does patient have siblings?: Yes Number of Siblings: 5 Description of patient's current relationship with siblings: "I have a brother and 2 sisters that live with my mother and we talk often but I don't talk to my 2 brothers that live out of state" Did patient suffer any verbal/emotional/physical/sexual abuse as a child?: Yes (Pt reports being bullied in school) Did patient suffer from severe childhood neglect?: Yes Patient description of severe childhood neglect: Pt reports a lack of supervision due to parents both working 2 jobs Has patient ever been sexually abused/assaulted/raped as an adolescent or adult?: No Was the patient ever a victim of a crime or a disaster?: No Witnessed domestic violence?: No Has patient been affected by domestic violence as an adult?: No  Education:  Highest grade of school patient has completed: 12th grade Currently a student?: Yes Name of school: [employer] How long has the patient attended?: Printmaker, 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?: No  Financial Resources:   Financial resources: Income from employment, Income from spouse, Medicaid Does patient have a representative payee or guardian?: No  Alcohol/Substance Abuse:   What has been your use of drugs/alcohol within the last 12 months?: Pt reports using Delta 8 3 times a day and Alcohol 2 times a month If attempted suicide, did drugs/alcohol play a role in this?: No Alcohol/Substance Abuse Treatment Hx: Denies past history Has alcohol/substance abuse ever caused legal problems?: No  Social Support System:   Conservation officer, natureatient's Community Support System: Passenger transport managerGood Describe Community Support System: Girlfriend, Best Friend, and Family Type of faith/religion: Norse-Paganism How does patient's faith help to cope with current illness?: Prayer  Leisure/Recreation:   Do You Have Hobbies?: Yes Leisure and Hobbies: Story-Telling, MOvies, Music, [employer]  Strengths/Needs:   What is the patient's perception of their strengths?: Good memory, physically strong, workk ethic Patient states they can use these personal strengths during their treatment to contribute to their recovery: "I can write down my thoughts and use other coping skills like working out" Patient states these barriers may affect/interfere with their treatment: None Patient states these barriers may affect their return to the community: None Other important information patient would like considered in planning for their treatment: None  Discharge Plan:   Currently receiving community mental health services: No Patient states concerns and preferences for aftercare planning are: Pt is interested in outpatient therapy and medication management Patient states they will know when they are safe and ready for discharge when:  "I feel like I am ready now" Does patient have access to transportation?: Yes (Own Car and Girlfriend) Does patient have financial barriers related to discharge medications?: Yes Patient description of barriers related to discharge medications: Limited Income Will patient be returning to same living situation after discharge?: Yes  Summary/Recommendations:   Summary and Recommendations (to be completed by the evaluator): Devon NashWilliam Lopez is a 21 year old, male, who was admitted to the hospital due to substance use, auditory and visual hallucinations, and anxiety.  The Pt reports having 1 suicide attempt in 2018 but denies any current suicidal thoughts.  The Pt reports living with his girlfriend.  He states that he has a good relationship with his mother, father, and 3 of his siblings.  He states that he does not talk to 2 of his brothers that live out of state.  He reports a lack of supervision during childhood due to both parents working 2 jobs.  He also reports being bullied often by his peers.  The Pt states that he is a Printmakerreshman at [employer] and is majoring in [employer]/Psychology.  He states that he is working part-time at [employer].  He reports being unable to work due to Nerve Damage he received at age 21 from an accident.  He states that his mental health affects his physical health and causes him to be unable to work for an extended time.  The Pt reports using Delta 8 3 times a day and Alcohol 2 times a month.  He reports that he is stopping Delta 8 due to his mental health.  He denies any current or previous substance use treatment.  While in the hospital the Pt can benefit from crisis stabilization, medication evaluation, group therapy, psycho-education, case management, and discharge  planning.  Upon discharge the Pt would like to return to his girlfriend's home.  It is recommended that the Pt follow-up with a local outpatient provider to participate in therapy and medication management  services.  Devon Lopez. 08/22/2021

## 2021-08-22 NOTE — [facility] Suicide Risk Assessment (Signed)
[facility] Admission Suicide Risk Assessment   Nursing information obtained from:  Patient Demographic factors:  Male, Adolescent or young adult, Low socioeconomic status, Unemployed Current Mental Status:  Self-harm behaviors Loss Factors:  Financial problems / change in socioeconomic status Historical Factors:  Prior suicide attempts Risk Reduction Factors:  Living with another person, especially a relative  Total Time spent with patient: 30 minutes Principal Problem: Bipolar affective disorder, current episode depressed (HCC) Diagnosis:  Principal Problem:   Bipolar affective disorder, current episode depressed (HCC) Active Problems:   Chronic post-traumatic stress disorder (PTSD)   Delta-9-tetrahydrocannabinol (THC) dependence (HCC)   GAD (generalized anxiety disorder)   ADHD   Dissociative episodes  Subjective Data:  Patient is a 21 year old male with a reported psychiatric history of bipolar disorder, GAD, ADHD, PTSD" who was admitted from the [facility] for evaluation suicidal thoughts, auditory hallucinations, visual hallucinations, and recent dissociative episodes.  He is medically cleared by the [facility] prior to admission to this hospital.  Home Psychiatric medications: None   At the [facility], the patient was started on Abilify 5 mg once daily, which was titrated to 10 mg once daily for mood stabilization; and also started on gabapentin 100 mg 3 times daily for neuropathic pain (which was what the patient was self-medicating with delta 8).  The patient reports that since starting Abilify, and being transferred to the psychiatric hospital, his mood is much improved.  He reports feeling less depressed and less dysphoric.  He reports the auditory hallucinations have become less intense, less frequent, and less bothersome.  He reports most recent auditory hallucination was not voices but instead like snapping of fingers.  Denies VH.  Denies paranoid symptoms, thought insertion, thought control, or ideas  of reference. He reports that sleep is some better, with less anxiety, less bothered by auditory hallucinations, and less head and neck pain since starting gabapentin. Reports appetite is okay, energy level is okay, and the concentration has improved.  At this time he denies having any suicidal thoughts, and reports that suicidal thoughts have decreased in frequency and severity since initial admission to the [facility].  He denies having any HI. He reports that anxiety level is improved over the last 3 to 4 days.  Denies having any panic attack. He denies having any dissociative episodes in the last 4 days, since initial presentation to the [facility], we discussed that this could be due to him not using the delta 8, and or starting psychiatric medication for underlying psychiatric illness. As noted in the initial assessment at the [facility], patient does have a history of trauma.    Past psychiatric history: Bipolar disorder, GAD, ADHD, PTSD Patient reports 1 hospitalization in the past Patient reports 1 suicide attempt in the past, age 89, drank bleach Home psychiatric medications: None Past psychiatric medication history: Seroquel, lamotrigine, Adderall, lithium  Past medical history: Patient had a ski accident, neck pain since that time.  Reports gabapentin is helping.  Patient reports previously self-medicating this pain with delta 8 Denies seizure history Denies surgical history NKDA  Family history: Cousin diagnosed with schizophrenia.  Family suicide history: Cousin was diagnosed with cranial committed suicide  Social history: Born and raised in [place].  Moved to [place] around the age of 78.  Has girlfriend.  No children.  Unemployed.  Substance use: Uses delta a daily.  Rare alcohol use.  Denies nicotine or tobacco use.  Denies recent marijuana use.  Denies other illicit drug use.    Continued Clinical Symptoms:  The "Alcohol Use Disorders Identification Test", Guidelines for Use in  Primary Care, Second Edition.  World Science writer [facility]). Score between 0-7:  no or low risk or alcohol related problems. Score between 8-15:  moderate risk of alcohol related problems. Score between 16-19:  high risk of alcohol related problems. Score 20 or above:  warrants further diagnostic evaluation for alcohol dependence and treatment.   CLINICAL FACTORS:   Bipolar Disorder:   Depressive phase Alcohol/Substance Abuse/Dependencies Chronic Pain More than one psychiatric diagnosis Previous Psychiatric Diagnoses and Treatments   Musculoskeletal: Strength & Muscle Tone: within normal limits Gait & Station: normal Patient leans: N/A  Psychiatric Specialty Exam:  Presentation  General Appearance: Casual  Eye Contact:Good  Speech:Normal Rate  Speech Volume:Normal  Handedness:Right   Mood and Affect  Mood:Anxious  Affect:Congruent   Thought Process  Thought Processes:Linear  Descriptions of Associations:Intact  Orientation:Full (Time, Place and Person)  Thought Content:Logical  History of Schizophrenia/Schizoaffective disorder:No  Duration of Psychotic Symptoms:Less than six months  Hallucinations:Hallucinations: Auditory Description of Auditory Hallucinations: like snapping of fingers Description of Visual Hallucinations: Denied today, saw ant yesterday  Ideas of Reference:None  Suicidal Thoughts:Suicidal Thoughts: No SI Active Intent and/or Plan: -- (Denies) SI Passive Intent and/or Plan: -- (Denies)  Homicidal Thoughts:Homicidal Thoughts: No   Sensorium  Memory:Immediate Good; Recent Good; Remote Good  Judgment:Fair  Insight:Fair   Executive Functions  Concentration:Fair  Attention Span:Fair  Recall:Good  Fund of Knowledge:Good  Language:Good   Psychomotor Activity  Psychomotor Activity:Psychomotor Activity: Normal   Assets  Assets:Communication Skills; Desire for Improvement; Financial Resources/Insurance; Housing;  Intimacy; Resilience; Social Support; Transportation   Sleep  Sleep:Sleep: Fair    Physical Exam: Physical Exam See H&P  ROS See H&P  Blood pressure 128/90, pulse 89, temperature 97.7 F (36.5 C), temperature source Oral, resp. rate 16, height 6' (1.829 m), weight 87.1 kg, SpO2 98 %. Body mass index is 26.04 kg/m.   COGNITIVE FEATURES THAT CONTRIBUTE TO RISK:  None    SUICIDE RISK:   Moderate:  Frequent suicidal ideation with limited intensity, and duration, some specificity in terms of plans, no associated intent, good self-control, limited dysphoria/symptomatology, some risk factors present, and identifiable protective factors, including available and accessible social support.  PLAN OF CARE:   See H&P for diagnosis list, assessment, and plan.    I certify that inpatient services furnished can reasonably be expected to improve the patient's condition.   Cristy Hilts, MD 08/22/2021, 12:50 PM

## 2021-08-23 ENCOUNTER — Encounter (HOSPITAL_COMMUNITY): Payer: Self-pay

## 2021-08-23 DIAGNOSIS — F314 Bipolar disorder, current episode depressed, severe, without psychotic features: Secondary | ICD-10-CM

## 2021-08-23 LAB — LIPID PANEL
Cholesterol: 169 mg/dL (ref 0–200)
HDL: 33 mg/dL — ABNORMAL LOW (ref >40)
LDL Cholesterol: 86 mg/dL (ref 0–99)
Total CHOL/HDL Ratio: 5.1 RATIO
Triglycerides: 251 mg/dL — ABNORMAL HIGH (ref <150)
VLDL: 50 mg/dL — ABNORMAL HIGH (ref 0–40)

## 2021-08-23 MED ORDER — ARIPIPRAZOLE 10 MG PO TABS: 10.0000 mg | ORAL_TABLET | Freq: Every day | ORAL | 0 refills | Status: DC

## 2021-08-23 MED ORDER — GABAPENTIN 100 MG PO CAPS: 200.0000 mg | ORAL_CAPSULE | Freq: Three times a day (TID) | ORAL | 0 refills | Status: DC

## 2021-08-23 MED ORDER — DICLOFENAC SODIUM 1 % EX GEL: 2.0000 g | Freq: Four times a day (QID) | CUTANEOUS | 0 refills | Status: AC

## 2021-08-23 NOTE — Progress Notes (Signed)
Patient ID: Devon Lopez, male   DOB: 04/16/2000, 21 y.o.   MRN: [id number] Patient discharged home per MD order. Patient received all personal belongings from unit and locker. Patient denies any thoughts of self harm. Patient left ambulatory with his girlfriend. Reviewed AVS/Transition record with patient; follow ups are in place.

## 2021-08-23 NOTE — Progress Notes (Signed)
   08/23/21 0900  Psych Admission Type (Psych Patients Only)  Admission Status Voluntary  Psychosocial Assessment  Patient Complaints Anxiety;Depression  Eye Contact Fair  Facial Expression Anxious  Affect Appropriate to circumstance  Speech Logical/coherent  Interaction Assertive  Motor Activity Fidgety  Appearance/Hygiene Unremarkable  Behavior Characteristics Cooperative  Mood Depressed;Anxious  Thought Process  Coherency WDL  Content WDL  Delusions None reported or observed  Perception WDL  Hallucination None reported or observed  Judgment Impaired  Confusion None  Danger to Self  Current suicidal ideation? Denies  Agreement Not to Harm Self Yes  Description of Agreement verbal  Danger to Others  Danger to Others None reported or observed

## 2021-08-23 NOTE — [facility] Suicide Risk Assessment (Signed)
[facility]   Principal Problem: Bipolar affective disorder, current episode depressed (HCC) Discharge Diagnoses: Principal Problem:   Bipolar affective disorder, current episode depressed (HCC) Active Problems:   Chronic post-traumatic stress disorder (PTSD)   Delta-9-tetrahydrocannabinol (THC) dependence (HCC)   GAD (generalized anxiety disorder)   ADHD   Dissociative episodes   Total Time spent with patient: 20 minutes  Patient is a 21 year old male with a reported psychiatric history of bipolar disorder, GAD, ADHD, PTSD" [facility] suicidal thoughts, auditory hallucinations, visual hallucinations, and recent dissociative episodes.    During the patient's hospitalization, patient had extensive initial psychiatric evaluation, and follow-up psychiatric evaluations every day.   Psychiatric diagnoses provided upon initial assessment:  -Bipolar disorder, type II, current episode is depressed -GAD -PTSD -Reported history of ADHD -Delta 8 use -Reported history of TBI   Patient's psychiatric medications were adjusted on admission:  -Continue Abilify 10 mg once daily -Increase gabapentin from 100 mg to 200 mg 3 times daily for neuropathic pain, mood stabilization and anxiety -Counseled on the importance of cessation from illicit substance use including delta 8 use, as this likely destabilizes his Axis I symptoms including dissociative symptoms   During the hospitalization, other adjustments were made to the patient's psychiatric medication regimen: none   Patient's care was discussed during the interdisciplinary team meeting every day during the hospitalization.   The patient denied having side effects to prescribed psychiatric medication.   Gradually, patient started adjusting to milieu. The patient was evaluated each day by a clinical provider to ascertain response to treatment. Improvement was noted by the patient's  report of decreasing symptoms, improved sleep and appetite, affect, medication tolerance, behavior, and participation in unit programming.  Patient was asked each day to complete a self inventory noting mood, mental status, pain, new symptoms, anxiety and concerns.     Symptoms were reported as significantly decreased or resolved completely by discharge.    On day of discharge, the patient reports that their mood is stable. The patient denied having suicidal thoughts for more than 48 hours prior to discharge.  Patient denies having homicidal thoughts.  Patient denies having auditory hallucinations.  Patient denies any visual hallucinations or other symptoms of psychosis. The patient was motivated to continue taking medication with a goal of continued improvement in mental health.    The patient reports their target psychiatric symptoms of psychosis, mood lability, suicidal thoughts, and dissociative episodes, all responded well to the psychiatric medications, and the patient reports overall benefit other psychiatric hospitalization. Supportive psychotherapy was provided to the patient. The patient also participated in regular group therapy while hospitalized. Coping skills, problem solving as well as relaxation therapies were also part of the unit programming.   Labs were reviewed with the patient, and abnormal results were discussed with the patient.   The patient is able to verbalize their individual safety plan to this provider.   # It is recommended to the patient to continue psychiatric medications as prescribed, after discharge from the hospital.     # It is recommended to the patient to follow up with your outpatient psychiatric provider and PCP.   # It was discussed with the patient, the impact of alcohol, drugs, tobacco have been there overall psychiatric and medical wellbeing, and total abstinence from substance use was recommended the patient.ed.   # Prescriptions provided or sent  directly to preferred pharmacy at discharge. Patient agreeable to plan. Given opportunity to ask  questions. Appears to feel comfortable with discharge.    # In the event of worsening symptoms, the patient is instructed to call the crisis hotline, 911 and or go to the nearest ED for appropriate evaluation and treatment of symptoms. To follow-up with primary care provider for other medical issues, concerns and or health care needs   # Patient was discharged home with a plan to follow up as noted below.     Musculoskeletal: Strength & Muscle Tone: within normal limits Gait & Station: normal Patient leans: N/A  Psychiatric Specialty Exam  Presentation  General Appearance: Appropriate for Environment; Casual; Fairly Groomed  Eye Contact:Good  Speech:Clear and Coherent; Normal Rate  Speech Volume:Normal  Handedness:Right   Mood and Affect  Mood:Euthymic  Duration of Depression Symptoms: Greater than two weeks  Affect:Appropriate; Congruent; Full Range   Thought Process  Thought Processes:Linear  Descriptions of Associations:Intact  Orientation:Full (Time, Place and Person)  Thought Content:Logical  History of Schizophrenia/Schizoaffective disorder:No  Duration of Psychotic Symptoms:Less than six months  Hallucinations:Hallucinations: None Description of Auditory Hallucinations: like snapping of fingers  Ideas of Reference:None  Suicidal Thoughts:Suicidal Thoughts: No  Homicidal Thoughts:Homicidal Thoughts: No   Sensorium  Memory:Immediate Good; Recent Good; Remote Good  Judgment:Good  Insight:Good   Executive Functions  Concentration:Good  Attention Span:Good  Recall:Good  Fund of Knowledge:Good  Language:Good   Psychomotor Activity  Psychomotor Activity:Psychomotor Activity: Normal (No EPS on exam today. AIMS score zero on day of dc.)   Assets  Assets:Communication Skills; Desire for Improvement; Financial Resources/Insurance; Housing;  Intimacy; Resilience; Social Support; Transportation   Sleep  Sleep:Sleep: Good   Physical Exam: Physical Exam See discharge summary  ROS See discharge summary   Blood pressure 136/73, pulse 93, temperature 97.6 F (36.4 C), temperature source Oral, resp. rate 16, height 6' (1.829 m), weight 87.1 kg, SpO2 99 %. Body mass index is 26.04 kg/m.  Mental Status Per Nursing Assessment::   On Admission:  Self-harm behaviors  Demographic factors:  Male, Adolescent or young adult, Low socioeconomic status, Unemployed Loss Factors:  Financial problems / change in socioeconomic status Historical Factors:  Prior suicide attempts Risk Reduction Factors:  Living with another person, especially a relative  Continued Clinical Symptoms:  Bipolar disorder - mood is stable. Denies SI.  Dissociative episodes - none reported since admission  Cognitive Features That Contribute To Risk:  None    Suicide Risk:  Mild:  There are no identifiable suicide plans, no associated intent, mild dysphoria and related symptoms, good self-control (both objective and subjective assessment), few other risk factors, and identifiable protective factors, including available and accessible social support.   Follow-up Information     [facility] . Go on 08/24/2021.   Why: You have a hospital follow up appointment for therapy and medication management services on 08/24/21 at 1:00 pm.  This appointment will be held in person. Contact information: [address] [place] [address] [phone]                 Plan Of Care/Follow-up recommendations:   Activity: as tolerated   Diet: heart healthy   Other: -Follow-up with your outpatient psychiatric provider -instructions on appointment date, time, and address (location) are provided to you in discharge paperwork.   -Take your psychiatric medications as prescribed at discharge - instructions are provided to you in the discharge  paperwork   -Follow-up with outpatient primary care doctor and other specialists -for management of chronic medical disease and neck pain (  now on gabapentin).    -Testing: Follow-up with outpatient provider for abnormal lab results:  Abnl lipid panel (fasting) TG 251, HDL 31, VLDL 50, LDL 86   -Recommend abstinence from alcohol, tobacco, and other illicit drug use at discharge.    -If your psychiatric symptoms recur, worsen, or if you have side effects to your psychiatric medications, call your outpatient psychiatric provider, 911, 988 or go to the nearest emergency department.   -If suicidal thoughts recur, call your outpatient psychiatric provider, 911, 988 or go to the nearest emergency department.   Cristy Hilts, MD 08/23/2021, 10:49 AM

## 2021-08-23 NOTE — Discharge Summary (Signed)
Physician Discharge Summary Note  Patient:  Devon Lopez is an 21 y.o., male MRN:  [id number] DOB:  04/06/2000 Patient phone:  [phone] (home)  Patient address:   [address] [place] [id number],  Total Time spent with patient: 20 minutes  Date of Admission:  08/21/2021 Date of Discharge: 08-23-2021  Reason for Admission:    Patient is a 21 year old male with a reported psychiatric history of bipolar disorder, GAD, ADHD, PTSD" who [facility] suicidal thoughts, auditory hallucinations, visual hallucinations, and recent dissociative episodes.     Principal Problem: Bipolar affective disorder, current episode depressed [facility](HCC) Discharge Diagnoses: Principal Problem:   Bipolar affective disorder, current episode depressed (HCC) Active Problems:   Chronic post-traumatic stress disorder (PTSD)   Delta-9-tetrahydrocannabinol (THC) dependence (HCC)   GAD (generalized anxiety disorder)   ADHD   Dissociative episodes   Past Psychiatric History:  Bipolar disorder, GAD, ADHD, PTSD Patient reports 1 hospitalization in the past Patient reports 1 suicide attempt in the past, age 21, drank bleach Home psychiatric medications: None Past psychiatric medication history: Seroquel, lamotrigine, Adderall, lithium   Past Medical History:  Past Medical History:  Diagnosis Date   Anxiety    Bipolar 2 disorder (HCC)    Depression    PTSD (post-traumatic stress disorder)    Suicide attempt (HCC)    TBI (traumatic brain injury) (HCC) 2016   History reviewed. No pertinent surgical history. Family History: History reviewed. No pertinent family history.  Family Psychiatric  History:  Cousin diagnosed with schizophrenia.  Family suicide history: Cousin was diagnosed with cranial committed suicide    Social History:  Social History   Substance and Sexual Activity  Alcohol Use Never     Social History   Substance and Sexual Activity  Drug  Use Yes   Types: Marijuana    Social History   Socioeconomic History   Marital status: Single    Spouse name: Not on file   Number of children: Not on file   Years of education: Not on file   Highest education level: Not on file  Occupational History   Not on file  Tobacco Use   Smoking status: Never   Smokeless tobacco: Never  Vaping Use   Vaping Use: Never used  Substance and Sexual Activity   Alcohol use: Never   Drug use: Yes    Types: Marijuana   Sexual activity: Not Currently  Other Topics Concern   Not on file  Social History Narrative   Not on file   Social Determinants of Health   Financial Resource Strain: Not on file  Food Insecurity: Not on file  Transportation Needs: Not on file  Physical Activity: Not on file  Stress: Not on file  Social Connections: Not on file    Hospital Course:      During the patient's hospitalization, patient had extensive initial psychiatric evaluation, and follow-up psychiatric evaluations every day.  Psychiatric diagnoses provided upon initial assessment:  -Bipolar disorder, type II, current episode is depressed -GAD -PTSD -Reported history of ADHD -Delta 8 use -Reported history of TBI  Patient's psychiatric medications were adjusted on admission:  -Continue Abilify 10 mg once daily -Increase gabapentin from 100 mg to 200 mg 3 times daily for neuropathic pain, mood stabilization and anxiety -Counseled on the importance of cessation from illicit substance use including delta 8 use, as this likely destabilizes his Axis I symptoms including dissociative symptoms  During the hospitalization, other adjustments were made  to the patient's psychiatric medication regimen: none  Patient's care was discussed during the interdisciplinary team meeting every day during the hospitalization.  The patient denied having side effects to prescribed psychiatric medication.  Gradually, patient started adjusting to milieu. The patient  was evaluated each day by a clinical provider to ascertain response to treatment. Improvement was noted by the patient's report of decreasing symptoms, improved sleep and appetite, affect, medication tolerance, behavior, and participation in unit programming.  Patient was asked each day to complete a self inventory noting mood, mental status, pain, new symptoms, anxiety and concerns.    Symptoms were reported as significantly decreased or resolved completely by discharge.   On day of discharge, the patient reports that their mood is stable. The patient denied having suicidal thoughts for more than 48 hours prior to discharge.  Patient denies having homicidal thoughts.  Patient denies having auditory hallucinations.  Patient denies any visual hallucinations or other symptoms of psychosis. The patient was motivated to continue taking medication with a goal of continued improvement in mental health.   The patient reports their target psychiatric symptoms of psychosis, mood lability, suicidal thoughts, and dissociative episodes, all responded well to the psychiatric medications, and the patient reports overall benefit other psychiatric hospitalization. Supportive psychotherapy was provided to the patient. The patient also participated in regular group therapy while hospitalized. Coping skills, problem solving as well as relaxation therapies were also part of the unit programming.  Labs were reviewed with the patient, and abnormal results were discussed with the patient.  The patient is able to verbalize their individual safety plan to this provider.  # It is recommended to the patient to continue psychiatric medications as prescribed, after discharge from the hospital.    # It is recommended to the patient to follow up with your outpatient psychiatric provider and PCP.  # It was discussed with the patient, the impact of alcohol, drugs, tobacco have been there overall psychiatric and medical wellbeing,  and total abstinence from substance use was recommended the patient.ed.  # Prescriptions provided or sent directly to preferred pharmacy at discharge. Patient agreeable to plan. Given opportunity to ask questions. Appears to feel comfortable with discharge.    # In the event of worsening symptoms, the patient is instructed to call the crisis hotline, 911 and or go to the nearest ED for appropriate evaluation and treatment of symptoms. To follow-up with primary care provider for other medical issues, concerns and or health care needs  # Patient was discharged home with a plan to follow up as noted below.   Physical Findings: AIMS: Facial and Oral Movements Muscles of Facial Expression: None, normal Lips and Perioral Area: None, normal Jaw: None, normal Tongue: None, normal,Extremity Movements Upper (arms, wrists, hands, fingers): None, normal Lower (legs, knees, ankles, toes): None, normal, Trunk Movements Neck, shoulders, hips: None, normal, Overall Severity Severity of abnormal movements (highest score from questions above): None, normal Incapacitation due to abnormal movements: None, normal Patient's awareness of abnormal movements (rate only patient's report): No Awareness, Dental Status Current problems with teeth and/or dentures?: No Does patient usually wear dentures?: No  CIWA:    COWS:     Musculoskeletal: Strength & Muscle Tone: within normal limits Gait & Station: normal Patient leans: N/A   Psychiatric Specialty Exam:  Presentation  General Appearance: Appropriate for Environment; Casual; Fairly Groomed  Eye Contact:Good  Speech:Clear and Coherent; Normal Rate  Speech Volume:Normal  Handedness:Right   Mood and Affect  Mood:Euthymic  Affect:Appropriate; Congruent; Full Range   Thought Process  Thought Processes:Linear  Descriptions of Associations:Intact  Orientation:Full (Time, Place and Person)  Thought Content:Logical  History of  Schizophrenia/Schizoaffective disorder:No  Duration of Psychotic Symptoms:Less than six months  Hallucinations:Hallucinations: None Description of Auditory Hallucinations: like snapping of fingers  Ideas of Reference:None  Suicidal Thoughts:Suicidal Thoughts: No  Homicidal Thoughts:Homicidal Thoughts: No   Sensorium  Memory:Immediate Good; Recent Good; Remote Good  Judgment:Good  Insight:Good   Executive Functions  Concentration:Good  Attention Span:Good  Recall:Good  Fund of Knowledge:Good  Language:Good   Psychomotor Activity  Psychomotor Activity:Psychomotor Activity: Normal (No EPS on exam today. AIMS score zero on day of dc.)   Assets  Assets:Communication Skills; Desire for Improvement; Financial Resources/Insurance; Housing; Intimacy; Resilience; Social Support; Transportation   Sleep  Sleep:Sleep: Good    Physical Exam: Physical Exam Vitals reviewed.  Constitutional:      General: He is not in acute distress.    Appearance: He is not toxic-appearing.  Pulmonary:     Effort: Pulmonary effort is normal.  Neurological:     Mental Status: He is alert.     Motor: No weakness.     Gait: Gait normal.  Psychiatric:        Mood and Affect: Mood normal.        Behavior: Behavior normal.        Thought Content: Thought content normal.        Judgment: Judgment normal.    Review of Systems  Constitutional:  Negative for chills and fever.  Cardiovascular:  Negative for chest pain and palpitations.  Musculoskeletal:  Positive for neck pain.  Neurological:  Negative for dizziness, tingling, tremors and headaches.  Psychiatric/Behavioral:  Positive for substance abuse. Negative for hallucinations and suicidal ideas. The patient is not nervous/anxious and does not have insomnia.    Blood pressure 136/73, pulse 93, temperature 97.6 F (36.4 C), temperature source Oral, resp. rate 16, height 6' (1.829 m), weight 87.1 kg, SpO2 99 %. Body mass index is  26.04 kg/m.   Social History   Tobacco Use  Smoking Status Never  Smokeless Tobacco Never   Tobacco Cessation:  N/A, patient does not currently use tobacco products   Blood Alcohol level:  Lab Results  Component Value Date   ETH <10 08/19/2021    Metabolic Disorder Labs:  Lab Results  Component Value Date   HGBA1C 5.2 08/21/2021   MPG 102.54 08/21/2021   No results found for: "PROLACTIN" Lab Results  Component Value Date   CHOL 169 08/23/2021   TRIG 251 (H) 08/23/2021   HDL 33 (L) 08/23/2021   CHOLHDL 5.1 08/23/2021   VLDL 50 (H) 08/23/2021   LDLCALC 86 08/23/2021   LDLCALC UNABLE TO CALCULATE IF TRIGLYCERIDE OVER 400 mg/dL 16/12/9602    See Psychiatric Specialty Exam and Suicide Risk Assessment completed by Attending Physician prior to discharge.  Discharge destination:  Home  Is patient on multiple antipsychotic therapies at discharge:  No   Has Patient had three or more failed trials of antipsychotic monotherapy by history:  No  Recommended Plan for Multiple Antipsychotic Therapies: NA  Discharge Instructions     Diet - low sodium heart healthy   Complete by: As directed    Increase activity slowly   Complete by: As directed       Allergies as of 08/23/2021   No Known Allergies      Medication List     TAKE these medications  Indication  ARIPiprazole 10 MG tablet Commonly known as: ABILIFY Take 1 tablet (10 mg total) by mouth daily.  Indication: MIXED BIPOLAR AFFECTIVE DISORDER   diclofenac Sodium 1 % Gel Commonly known as: VOLTAREN Apply 2 g topically 4 (four) times daily.  Indication: Joint Damage causing Pain and Loss of Function   gabapentin 100 MG capsule Commonly known as: NEURONTIN Take 2 capsules (200 mg total) by mouth 3 (three) times daily. What changed: how much to take  Indication: Neuropathic Pain        Follow-up Information     [facility]. Go on 08/24/2021.   Why: You have a hospital follow  up appointment for therapy and medication management services on 08/24/21 at 1:00 pm.  This appointment will be held in person. Contact information: [address] [place] [address] [phone]                 Follow-up recommendations:    Activity: as tolerated  Diet: heart healthy  Other: -Follow-up with your outpatient psychiatric provider -instructions on appointment date, time, and address (location) are provided to you in discharge paperwork.  -Take your psychiatric medications as prescribed at discharge - instructions are provided to you in the discharge paperwork  -Follow-up with outpatient primary care doctor and other specialists -for management of chronic medical disease and neck pain (now on gabapentin).   -Testing: Follow-up with outpatient provider for abnormal lab results:  Abnl lipid panel (fasting) TG 251, HDL 31, VLDL 50, LDL 86  -Recommend abstinence from alcohol, tobacco, and other illicit drug use at discharge.   -If your psychiatric symptoms recur, worsen, or if you have side effects to your psychiatric medications, call your outpatient psychiatric provider, 911, 988 or go to the nearest emergency department.  -If suicidal thoughts recur, call your outpatient psychiatric provider, 911, 988 or go to the nearest emergency department.    Signed: Cristy Hilts, MD 08/23/2021, 10:45 AM  Total Time Spent in Direct Patient Care:  I personally spent 35 minutes on the unit in direct patient care. The direct patient care time included face-to-face time with the patient, reviewing the patient's chart, communicating with other professionals, and coordinating care. Greater than 50% of this time was spent in counseling or coordinating care with the patient regarding goals of hospitalization, psycho-education, and discharge planning needs.   Phineas Inches, MD Psychiatrist

## 2021-08-23 NOTE — Group Note (Signed)
Date:  08/23/2021 Time:  11:34 AM  Group Topic/Focus:  Orientation:   The focus of this group is to educate the patient on the purpose and policies of crisis stabilization and provide a format to answer questions about their admission.  The group details unit policies and expectations of patients while admitted.    Participation Level:  Active  Participation Quality:  Appropriate  Affect:  Appropriate  Cognitive:  Appropriate  Insight: Appropriate  Engagement in Group:  Engaged  Modes of Intervention:  Discussion  Additional Comments:    Jerrye Beavers 08/23/2021, 11:34 AM

## 2021-08-23 NOTE — Progress Notes (Signed)
  [facility] Adult Case Management Discharge Plan :  Will you be returning to the same living situation after discharge:  Yes,  Home  At discharge, do you have transportation home?: Yes,  Girlfriend  Do you have the ability to pay for your medications: Yes,  Family and Community Support  Release of information consent forms completed and in the chart;  Patient's signature needed at discharge.  Patient to Follow up at:  Follow-up Information     [facility]. Go on 08/24/2021.   Why: You have a hospital follow up appointment for therapy and medication management services on 08/24/21 at 1:00 pm.  This appointment will be held in person. Contact information: [address] [place] [address] [phone]                 Next level of care provider has access to [facility] Link:no  Safety Planning and Suicide Prevention discussed: Yes,  with patient and girlfriend      Has patient been referred to the Quitline?: N/A patient is not a smoker  Patient has been referred for addiction treatment: Pt. refused referral  Aram Beecham, LCSWA 08/23/2021, 9:24 AM

## 2021-08-23 NOTE — Group Note (Signed)
Date:  08/23/2021 Time:  12:08 PM  Group Topic/Focus: Mindfulness Managing Feelings:   The focus of this group is to identify what feelings patients have difficulty handling and develop a plan to handle them in a healthier way upon discharge.    Participation Level:  Active  Participation Quality:  Appropriate  Affect:  Appropriate  Cognitive:  Appropriate  Insight: Appropriate  Engagement in Group:  Engaged  Modes of Intervention:  Discussion  Additional Comments:  Pt identified ways he can incorporate coping strategies in his day to day life to help him when he's feeling overwhelmed.   Devon Lopez 08/23/2021, 12:08 PM

## 2021-08-23 NOTE — BH IP Treatment Plan (Signed)
Interdisciplinary Treatment and Diagnostic Plan Update  08/23/2021 Time of Session: 9:50am  Devon Lopez MRN: [id number]  Principal Diagnosis: Bipolar affective disorder, current episode depressed (HCC)  Secondary Diagnoses: Principal Problem:   Bipolar affective disorder, current episode depressed (HCC) Active Problems:   Chronic post-traumatic stress disorder (PTSD)   Delta-9-tetrahydrocannabinol (THC) dependence (HCC)   GAD (generalized anxiety disorder)   ADHD   Dissociative episodes   Current Medications:  Current Facility-Administered Medications  Medication Dose Route Frequency Provider Last Rate Last Admin   acetaminophen (TYLENOL) tablet 650 mg  650 mg Oral Q6H PRN Doda, Vandana, MD       alum & mag hydroxide-simeth (MAALOX/MYLANTA) 200-200-20 MG/5ML suspension 30 mL  30 mL Oral Q4H PRN Doda, Vandana, MD       ARIPiprazole (ABILIFY) tablet 10 mg  10 mg Oral Daily Doda, Vandana, MD   10 mg at 08/23/21 0759   diclofenac Sodium (VOLTAREN) 1 % topical gel 2 g  2 g Topical QID Karsten Ro, MD   2 g at 08/23/21 0759   gabapentin (NEURONTIN) capsule 200 mg  200 mg Oral TID Phineas Inches, MD   200 mg at 08/23/21 0759   hydrOXYzine (ATARAX) tablet 25 mg  25 mg Oral TID PRN Karsten Ro, MD       magnesium hydroxide (MILK OF MAGNESIA) suspension 30 mL  30 mL Oral Daily PRN Karsten Ro, MD       traZODone (DESYREL) tablet 50 mg  50 mg Oral QHS PRN Karsten Ro, MD       Current Outpatient Medications  Medication Sig Dispense Refill   ARIPiprazole (ABILIFY) 10 MG tablet Take 1 tablet (10 mg total) by mouth daily. 30 tablet 0   diclofenac Sodium (VOLTAREN) 1 % GEL Apply 2 g topically 4 (four) times daily. 240 g 0   gabapentin (NEURONTIN) 100 MG capsule Take 2 capsules (200 mg total) by mouth 3 (three) times daily. 180 capsule 0   PTA Medications: No medications prior to admission.    Patient Stressors:    Patient Strengths:    Treatment Modalities: Medication  Management, Group therapy, Case management,  1 to 1 session with clinician, Psychoeducation, Recreational therapy.   Physician Treatment Plan for Primary Diagnosis: Bipolar affective disorder, current episode depressed (HCC) Long Term Goal(s): Improvement in symptoms so as ready for discharge   Short Term Goals: Ability to identify changes in lifestyle to reduce recurrence of condition will improve Ability to verbalize feelings will improve Ability to disclose and discuss suicidal ideas Ability to demonstrate self-control will improve Ability to identify and develop effective coping behaviors will improve Ability to maintain clinical measurements within normal limits will improve Compliance with prescribed medications will improve Ability to identify triggers associated with substance abuse/mental health issues will improve  Medication Management: Evaluate patient's response, side effects, and tolerance of medication regimen.  Therapeutic Interventions: 1 to 1 sessions, Unit Group sessions and Medication administration.  Evaluation of Outcomes: Adequate for Discharge  Physician Treatment Plan for Secondary Diagnosis: Principal Problem:   Bipolar affective disorder, current episode depressed (HCC) Active Problems:   Chronic post-traumatic stress disorder (PTSD)   Delta-9-tetrahydrocannabinol (THC) dependence (HCC)   GAD (generalized anxiety disorder)   ADHD   Dissociative episodes  Long Term Goal(s): Improvement in symptoms so as ready for discharge   Short Term Goals: Ability to identify changes in lifestyle to reduce recurrence of condition will improve Ability to verbalize feelings will improve Ability to disclose and discuss suicidal ideas Ability  to demonstrate self-control will improve Ability to identify and develop effective coping behaviors will improve Ability to maintain clinical measurements within normal limits will improve Compliance with prescribed medications  will improve Ability to identify triggers associated with substance abuse/mental health issues will improve     Medication Management: Evaluate patient's response, side effects, and tolerance of medication regimen.  Therapeutic Interventions: 1 to 1 sessions, Unit Group sessions and Medication administration.  Evaluation of Outcomes: Adequate for Discharge   RN Treatment Plan for Primary Diagnosis: Bipolar affective disorder, current episode depressed (HCC) Long Term Goal(s): Knowledge of disease and therapeutic regimen to maintain health will improve  Short Term Goals: Ability to remain free from injury will improve, Ability to participate in decision making will improve, Ability to verbalize feelings will improve, Ability to disclose and discuss suicidal ideas, and Ability to identify and develop effective coping behaviors will improve  Medication Management: RN will administer medications as ordered by provider, will assess and evaluate patient's response and provide education to patient for prescribed medication. RN will report any adverse and/or side effects to prescribing provider.  Therapeutic Interventions: 1 on 1 counseling sessions, Psychoeducation, Medication administration, Evaluate responses to treatment, Monitor vital signs and CBGs as ordered, Perform/monitor CIWA, COWS, AIMS and Fall Risk screenings as ordered, Perform wound care treatments as ordered.  Evaluation of Outcomes: Adequate for Discharge   LCSW Treatment Plan for Primary Diagnosis: Bipolar affective disorder, current episode depressed (HCC) Long Term Goal(s): Safe transition to appropriate next level of care at discharge, Engage patient in therapeutic group addressing interpersonal concerns.  Short Term Goals: Engage patient in aftercare planning with referrals and resources, Increase social support, Increase emotional regulation, Facilitate acceptance of mental health diagnosis and concerns, Identify triggers  associated with mental health/substance abuse issues, and Increase skills for wellness and recovery  Therapeutic Interventions: Assess for all discharge needs, 1 to 1 time with Social worker, Explore available resources and support systems, Assess for adequacy in community support network, Educate family and significant other(s) on suicide prevention, Complete Psychosocial Assessment, Interpersonal group therapy.  Evaluation of Outcomes: Adequate for Discharge   Progress in Treatment: Attending groups: Yes. Participating in groups: Yes. Taking medication as prescribed: Yes. Toleration medication: Yes. Family/Significant other contact made: Yes, individual(s) contacted:  Girlfriend  Patient understands diagnosis: Yes. Discussing patient identified problems/goals with staff: Yes. Medical problems stabilized or resolved: Yes. Denies suicidal/homicidal ideation: Yes. Issues/concerns per patient self-inventory: No.   New problem(s) identified: No, Describe:  None   New Short Term/Long Term Goal(s): medication stabilization, elimination of SI thoughts, development of comprehensive mental wellness plan.   Patient Goals: "Figure out my diagnosis and get treatment for it"   Discharge Plan or Barriers: Pt will return home with his girlfriend and will follow up with [facility] in [facility] for therapy and medication management.  Reason for Continuation of Hospitalization: Medication stabilization  Estimated Length of Stay: Adequate for Discharge   Last 3 [place] Suicide Severity Risk Score: Flowsheet Row [facility] (Discharged[facility] Most recent reading at 08/21/2021  1:00 PM ED from 08/19/2021 in [facility] Most recent reading at 08/19/2021 11:50 PM ED from 08/19/2021 in [facility] Most recent reading at 08/19/2021  8:34 PM  C-SSRS RISK CATEGORY Error: Q3, 4, or 5 should not be  populated when Q2 is No High Risk Low Risk       Last PHQ 2/9 Scores:  No data to display          Scribe for Treatment Team: Catha Browngel M Hiram Mciver, LCSWA 08/23/2021 3:05 PM

## 2021-12-06 ENCOUNTER — Other Ambulatory Visit: Payer: Self-pay

## 2021-12-06 ENCOUNTER — Encounter (HOSPITAL_COMMUNITY): Payer: Self-pay

## 2021-12-06 ENCOUNTER — Emergency Department (HOSPITAL_COMMUNITY)
Admission: EM | Admit: 2021-12-06 | Discharge: 2021-12-07 | Disposition: A | Discharge status: Other Acute Inpatient Hospital | Payer: Medicaid Other | Attending: Emergency Medicine | Admitting: Emergency Medicine

## 2021-12-06 DIAGNOSIS — X789XXA Intentional self-harm by unspecified sharp object, initial encounter: Secondary | ICD-10-CM | POA: Insufficient documentation

## 2021-12-06 DIAGNOSIS — Z23 Encounter for immunization: Secondary | ICD-10-CM | POA: Insufficient documentation

## 2021-12-06 DIAGNOSIS — S51811A Laceration without foreign body of right forearm, initial encounter: Secondary | ICD-10-CM | POA: Insufficient documentation

## 2021-12-06 DIAGNOSIS — F332 Major depressive disorder, recurrent severe without psychotic features: Secondary | ICD-10-CM

## 2021-12-06 DIAGNOSIS — T1491XA Suicide attempt, initial encounter: Secondary | ICD-10-CM

## 2021-12-06 DIAGNOSIS — S51812A Laceration without foreign body of left forearm, initial encounter: Secondary | ICD-10-CM | POA: Insufficient documentation

## 2021-12-06 DIAGNOSIS — Z20822 Contact with and (suspected) exposure to covid-19: Secondary | ICD-10-CM | POA: Insufficient documentation

## 2021-12-06 NOTE — ED Triage Notes (Signed)
Patient reports last few weeks being really rough. Patient states he has been trying to get a job. Patient states today he had a 1/2 bottle of wine, sat in the tub and cut both of his wrist.

## 2021-12-06 NOTE — ED Provider Triage Note (Signed)
Emergency Medicine Provider Triage Evaluation Note  Devon Lopez , a 21 y.o. male  was evaluated in triage.  Pt complains of symptoms.  Patient reports prior to arrival he drank half a bottle and then got into the bathtub and cut both of his wrists several times horizontally because he feels like a burden and feels that his girlfriend who is at bedside with him would be better off if he was not alive.  He also reports that he took a handful of Tylenol.  He estimates that it was about 5 or 6 tablets, is unsure if this was regular strength or extra strength Tylenol.  Denies using any other substances or medications prior to arrival.  Review of Systems  Positive: Suicidal thoughts, lacerations Negative: Abdominal pain, nausea, vomiting, chest pain, shortness of breath  Physical Exam  BP (!) 171/90   Pulse 82   Temp 97.8 F (36.6 C) (Oral)   Resp 20   Ht 6' (1.829 m)   Wt 93.9 kg   SpO2 93%   BMI 28.07 kg/m  Gen:   Awake, no distress   Resp:  Normal effort  MSK:   Moves extremities without difficulty  Other:  Numerous superficial horizontal lacerations to bilateral forearms as well as some horizontal superficial lacerations to the left upper arm.  Medical Decision Making  Medically screening exam initiated at 10:56 PM.  Appropriate orders placed.  Hieu Herms was informed that the remainder of the evaluation will be completed by another provider, this initial triage assessment does not replace that evaluation, and the importance of remaining in the ED until their evaluation is complete.  Patient with suicide attempt, fortunately lacerations not deep enough to need suture repair, will need acute bed for further evaluation and monitoring and TTS evaluation.   Jacqlyn Larsen, [place] 12/06/21 2310

## 2021-12-07 ENCOUNTER — Encounter (HOSPITAL_COMMUNITY): Payer: Self-pay | Admitting: Nurse Practitioner

## 2021-12-07 ENCOUNTER — Other Ambulatory Visit: Payer: Self-pay

## 2021-12-07 ENCOUNTER — Inpatient Hospital Stay (HOSPITAL_COMMUNITY)
Admission: AD | Admit: 2021-12-07 | Discharge: 2021-12-13 | DRG: 885 | Disposition: A | Discharge status: Home / Self Care | Payer: Federal, State, Local not specified - Other | Attending: Psychiatry | Admitting: Psychiatry

## 2021-12-07 DIAGNOSIS — F1220 Cannabis dependence, uncomplicated: Secondary | ICD-10-CM | POA: Diagnosis present

## 2021-12-07 DIAGNOSIS — X788XXA Intentional self-harm by other sharp object, initial encounter: Secondary | ICD-10-CM | POA: Diagnosis present

## 2021-12-07 DIAGNOSIS — F411 Generalized anxiety disorder: Secondary | ICD-10-CM | POA: Diagnosis present

## 2021-12-07 DIAGNOSIS — Z23 Encounter for immunization: Secondary | ICD-10-CM

## 2021-12-07 DIAGNOSIS — F3181 Bipolar II disorder: Principal | ICD-10-CM | POA: Diagnosis present

## 2021-12-07 DIAGNOSIS — F4312 Post-traumatic stress disorder, chronic: Secondary | ICD-10-CM | POA: Diagnosis present

## 2021-12-07 DIAGNOSIS — Z818 Family history of other mental and behavioral disorders: Secondary | ICD-10-CM

## 2021-12-07 DIAGNOSIS — Z9151 Personal history of suicidal behavior: Secondary | ICD-10-CM | POA: Diagnosis not present

## 2021-12-07 DIAGNOSIS — Z20822 Contact with and (suspected) exposure to covid-19: Secondary | ICD-10-CM | POA: Diagnosis present

## 2021-12-07 DIAGNOSIS — Z87820 Personal history of traumatic brain injury: Secondary | ICD-10-CM

## 2021-12-07 DIAGNOSIS — F319 Bipolar disorder, unspecified: Secondary | ICD-10-CM

## 2021-12-07 LAB — CBC
HCT: 44.2 % (ref 39.0–52.0)
Hemoglobin: 14.7 g/dL (ref 13.0–17.0)
MCH: 26.5 pg (ref 26.0–34.0)
MCHC: 33.3 g/dL (ref 30.0–36.0)
MCV: 79.8 fL — ABNORMAL LOW (ref 80.0–100.0)
Platelets: 259 10*3/uL (ref 150–400)
RBC: 5.54 MIL/uL (ref 4.22–5.81)
RDW: 12.7 % (ref 11.5–15.5)
WBC: 8.8 10*3/uL (ref 4.0–10.5)
nRBC: 0 % (ref 0.0–0.2)

## 2021-12-07 LAB — RESP PANEL BY RT-PCR (FLU A&B, COVID) ARPGX2
Influenza A by PCR: NEGATIVE
Influenza B by PCR: NEGATIVE
SARS Coronavirus 2 by RT PCR: NEGATIVE

## 2021-12-07 LAB — COMPREHENSIVE METABOLIC PANEL
ALT: 34 U/L (ref 0–44)
AST: 24 U/L (ref 15–41)
Albumin: 4.2 g/dL (ref 3.5–5.0)
Alkaline Phosphatase: 59 U/L (ref 38–126)
Anion gap: 8 (ref 5–15)
BUN: 12 mg/dL (ref 6–20)
CO2: 23 mmol/L (ref 22–32)
Calcium: 9.2 mg/dL (ref 8.9–10.3)
Chloride: 107 mmol/L (ref 98–111)
Creatinine, Ser: 0.98 mg/dL (ref 0.61–1.24)
GFR, Estimated: >60 mL/min (ref >60)
Glucose, Bld: 86 mg/dL (ref 70–99)
Potassium: 3.7 mmol/L (ref 3.5–5.1)
Sodium: 138 mmol/L (ref 135–145)
Total Bilirubin: 0.5 mg/dL (ref 0.3–1.2)
Total Protein: 7.2 g/dL (ref 6.5–8.1)

## 2021-12-07 LAB — ETHANOL: Alcohol, Ethyl (B): <10 mg/dL (ref <10)

## 2021-12-07 LAB — ACETAMINOPHEN LEVEL
Acetaminophen (Tylenol), Serum: <10 ug/mL — ABNORMAL LOW (ref 10–30)
Acetaminophen (Tylenol), Serum: <10 ug/mL — ABNORMAL LOW (ref 10–30)

## 2021-12-07 LAB — SALICYLATE LEVEL: Salicylate Lvl: <7 mg/dL — ABNORMAL LOW (ref 7.0–30.0)

## 2021-12-07 MED ORDER — BACITRACIN-NEOMYCIN-POLYMYXIN OINTMENT TUBE
TOPICAL_OINTMENT | Freq: Two times a day (BID) | CUTANEOUS | Status: DC
  Administered 2021-12-12: 1 via TOPICAL
  Filled 2021-12-07 (×2): qty 14.17

## 2021-12-07 MED ORDER — TRAZODONE HCL 50 MG PO TABS
50.0000 mg | ORAL_TABLET | Freq: Every evening | ORAL | Status: DC | PRN
  Administered 2021-12-09: 50 mg via ORAL
  Filled 2021-12-07 (×3): qty 1

## 2021-12-07 MED ORDER — PNEUMOCOCCAL 20-VAL CONJ VACC 0.5 ML IM SUSY
0.5000 mL | PREFILLED_SYRINGE | INTRAMUSCULAR | Status: AC
  Administered 2021-12-08: 0.5 mL via INTRAMUSCULAR
  Filled 2021-12-07: qty 0.5

## 2021-12-07 MED ORDER — ZIPRASIDONE HCL 20 MG PO CAPS
20.0000 mg | ORAL_CAPSULE | Freq: Two times a day (BID) | ORAL | Status: DC
  Administered 2021-12-07 – 2021-12-08 (×2): 20 mg via ORAL
  Filled 2021-12-07 (×7): qty 1

## 2021-12-07 MED ORDER — ALUM & MAG HYDROXIDE-SIMETH 200-200-20 MG/5ML PO SUSP: 30.0000 mL | ORAL | Status: DC | PRN

## 2021-12-07 MED ORDER — TETANUS-DIPHTH-ACELL PERTUSSIS 5-2.5-18.5 LF-MCG/0.5 IM SUSY
0.5000 mL | PREFILLED_SYRINGE | Freq: Once | INTRAMUSCULAR | Status: AC
  Administered 2021-12-07: 0.5 mL via INTRAMUSCULAR
  Filled 2021-12-07: qty 0.5

## 2021-12-07 MED ORDER — ACETAMINOPHEN 325 MG PO TABS: 650.0000 mg | ORAL_TABLET | Freq: Four times a day (QID) | ORAL | Status: DC | PRN

## 2021-12-07 MED ORDER — INFLUENZA VAC SPLIT QUAD 0.5 ML IM SUSY
0.5000 mL | PREFILLED_SYRINGE | INTRAMUSCULAR | Status: AC
  Administered 2021-12-08: 0.5 mL via INTRAMUSCULAR
  Filled 2021-12-07: qty 0.5

## 2021-12-07 MED ORDER — MAGNESIUM HYDROXIDE 400 MG/5ML PO SUSP: 30.0000 mL | Freq: Every day | ORAL | Status: DC | PRN

## 2021-12-07 MED ORDER — HYDROXYZINE HCL 25 MG PO TABS
25.0000 mg | ORAL_TABLET | Freq: Three times a day (TID) | ORAL | Status: DC | PRN
  Administered 2021-12-09: 25 mg via ORAL
  Filled 2021-12-07 (×3): qty 1

## 2021-12-07 NOTE — BH Assessment (Signed)
Clinician messaged Amanda C. Reynolds, RN: "Hey. It's Trey with TTS. Is the pt able to engage in the assessment, if so the pt will need to be placed in a private room. Is the pt under IVC? Also is the pt medically cleared?"  Clinician awaiting response.    Kayal Mula D Tameyah Koch, MS, LCMHC, [facility] Triage Specialist [phone]  

## 2021-12-07 NOTE — ED Notes (Signed)
[facility] transport called for transport needs.  

## 2021-12-07 NOTE — ED Provider Notes (Signed)
Devon Lopez Provider Note   CSN: [id number] Arrival date & time: 12/06/21  2232     History  Chief Complaint  Patient presents with   Suicide Attempt    Devon Lopez is a 21 y.o. male.  21 y/o male presents to the ED for c/o SI and suicide attempt. Patient reports prior to arrival he drank half a bottle of wine and then got into the bathtub and cut both of his wrists several times horizontally because he feels like a burden and feels that his girlfriend, who is at bedside with him, would be better off if he was not alive.  He also reports that he took a handful of Tylenol.  He estimates that it was about 5 or 6 tablets, is unsure if this was regular strength or extra strength Tylenol.  Unsure of exact ingestion time, but believes it was after 7 PM because it was dark outside.  Denies using any other substances or medications prior to arrival.  He does have a history of suicide attempt 5 years ago.  Wrote a "goodbye letter" prior to the events of this evening.  While he has seen counselors and therapists in the past, he is presently uninsured and has not had access to healthcare or specialist to address his worsening depression.        Home Medications Prior to Admission medications   Medication Sig Start Date End Date Taking? Authorizing Provider  ARIPiprazole (ABILIFY) 10 MG tablet Take 1 tablet (10 mg total) by mouth daily. 08/23/21 09/22/21  Nicholes Rough, NP  gabapentin (NEURONTIN) 100 MG capsule Take 2 capsules (200 mg total) by mouth 3 (three) times daily. 08/23/21 09/22/21  Janine Limbo, MD      Allergies    Patient has no known allergies.    Review of Systems   Review of Systems Ten systems reviewed and are negative for acute change, except as noted in the HPI.    Physical Exam Updated Vital Signs BP (!) 171/90   Pulse 82   Temp 97.8 F (36.6 C) (Oral)   Resp 20   Ht 6' (1.829 m)   Wt 93.9 kg   SpO2 93%   BMI 28.07  kg/m   Physical Exam Vitals and nursing note reviewed.  Constitutional:      General: He is not in acute distress.    Appearance: He is well-developed. He is not diaphoretic.  HENT:     Head: Normocephalic and atraumatic.  Eyes:     General: No scleral icterus.    Conjunctiva/sclera: Conjunctivae normal.  Pulmonary:     Effort: Pulmonary effort is normal. No respiratory distress.     Comments: Respirations even and unlabored Musculoskeletal:        General: Normal range of motion.     Cervical back: Normal range of motion.  Skin:    General: Skin is warm and dry.     Coloration: Skin is not pale.     Findings: No erythema or rash.     Comments: Multiple lacerations to the volar aspect of bilateral forearms.  Most lacerations are superficial, involving only the epidermis, though some do extend into the dermis.  There is no active bleeding to any of the existing wounds.  Neurological:     Mental Status: He is alert and oriented to person, place, and time.  Psychiatric:        Mood and Affect: Mood is depressed.  Speech: Speech normal.        Behavior: Behavior is cooperative.        Thought Content: Thought content includes suicidal ideation. Thought content includes suicidal plan.        Cognition and Memory: Memory normal.     ED Results / Procedures / Treatments   Labs (all labs ordered are listed, but only abnormal results are displayed) Labs Reviewed  SALICYLATE LEVEL - Abnormal; Notable for the following components:      Result Value   Salicylate Lvl <0.9 (*)    All other components within normal limits  ACETAMINOPHEN LEVEL - Abnormal; Notable for the following components:   Acetaminophen (Tylenol), Serum <10 (*)    All other components within normal limits  CBC - Abnormal; Notable for the following components:   MCV 79.8 (*)    All other components within normal limits  RESP PANEL BY RT-PCR (FLU A&B, COVID) ARPGX2  COMPREHENSIVE METABOLIC PANEL  ETHANOL   RAPID URINE DRUG SCREEN, HOSP PERFORMED  ACETAMINOPHEN LEVEL    EKG None  Radiology No results found.  Procedures Procedures    Medications Ordered in ED Medications  Tdap (BOOSTRIX) injection 0.5 mL (0.5 mLs Intramuscular Given 12/07/21 0113)    ED Course/ Medical Decision Making/ A&P                           Medical Decision Making Amount and/or Complexity of Data Reviewed Labs: ordered.  Risk Prescription drug management.   This patient presents to the ED for concern of suicide attempt, this involves an extensive number of treatment options, and is a complaint that carries with it a high risk of complications and morbidity.    Co morbidities that complicate the patient evaluation  TBI Depression Bipolar 2 disorder   Additional history obtained:  External records from outside source obtained and reviewed including behavioral hospitalization in 08/2021   Lab Tests:  I Ordered, and personally interpreted labs.  The pertinent results include:  normal CBC, CMP, ethanol, APAP level, ASA level.   Medicines ordered and prescription drug management:  I ordered medication including Tdap for tetanus prophylaxis  Reevaluation of the patient after these medicines showed that the patient stayed the same I have reviewed the patients home medicines and have made adjustments as needed   Consultations Obtained:  I requested consultation with the psychiatric team; recommendations pending   Reevaluation:  After the interventions noted above, I reevaluated the patient and found that they have : remained stable   Social Determinants of Health:  Uninsured    Dispostion:  Recommendations for disposition from TTS are presently pending, though anticipate inpatient hospitalization.  The patient has been medically cleared.  Care to be assumed by oncoming ED provider.         Final Clinical Impression(s) / ED Diagnoses Final diagnoses:  Suicide attempt  [facility])  Severe episode of recurrent major depressive disorder, without psychotic features [facility])    Rx / DC Orders ED Discharge Orders     None         Devon Breach, PA-C 98/33/82 [id number]    Devon Fuel, MD 97/67/34 [phone]

## 2021-12-07 NOTE — Progress Notes (Signed)
The patient rated his day as a 7.5 out of 10 since he misses being at home. His positive event for the day is that received clothing from home.

## 2021-12-07 NOTE — ED Notes (Signed)
Report given to [facility] RN 

## 2021-12-07 NOTE — [facility] Suicide Risk Assessment (Signed)
[facility] Admission Suicide Risk Assessment   Nursing information obtained from:  Patient Demographic factors:  Male, Caucasian, Adolescent or young adult Current Mental Status:  Suicidal ideation indicated by patient, Self-harm thoughts, Self-harm behaviors Loss Factors:  Financial problems / change in socioeconomic status Historical Factors:  NA Risk Reduction Factors:  Positive social support, Positive therapeutic relationship, Sense of responsibility to family, Living with another person, especially a relative, Positive coping skills or problem solving skills  Total Time spent with patient: 30 minutes Principal Problem: Bipolar 2 disorder, major depressive episode (HCC) Diagnosis:  Principal Problem:   Bipolar 2 disorder, major depressive episode (HCC) Active Problems:   Chronic post-traumatic stress disorder (PTSD)   Delta-9-tetrahydrocannabinol (THC) dependence (HCC)   GAD (generalized anxiety disorder)  Subjective Data:  Patient is a 21 year old male with a past psychiatric history of bipolar disorder, GAD, PTSD, and (reported) ADHD, who was admitted to the psychiatric unit from [facility] emergency department after suicide attempt via cutting his wrists and arms while drinking alcohol.     Prior to admission psychiatric medications: None (Stopped taking around July 2023)   On my assessment today, the patient reports that he stopped taking prescribed psychiatric his medications about 1 month after most recent discharge from this hospital.  He reports Abilify was causing him to feel akathetic, so he stopped this medication.  He also reports stopping gabapentin after he was unable to find a PCP to follow up with continue prescribing the gabapentin.  Patient reports predominantly depressive symptoms since discharge from hospital last time.  Denies manic episodes since last discharge.  He reports multiple psychosocial stress contributing to worsening depression and suicidal thoughts including  chronic pain, worsening self-esteem, not be able to find a full-time job due to chronic pain chronic daytime fatigue.  Patient states that he had become so overwhelmed with his inability to provide for himself or hold a job that he started to have thoughts that he was not good enough for his girlfriend and not good enough to be living, and that she would be better off without him and as well.  He reports suicidal thoughts intensified to the day of suicide attempt where he drank wine, and cut himself on bilateral arms and wrist with a razor.   The patient reports depressed mood, pervasive sadness, and anhedonia for many months.  Works feeling hopeless.  Reports he feels tired all the time due to his nerve issues and depression.  Reports that sleep is okay, without difficulty initiating or maintaining sleep.  Reports he does not snore.  Reports that appetite is decreased.  Reports continued have suicidal thoughts, passive, without any intent or plan at this time.  Denies HI.  Reports that anxiety is generalized, excessive, bothersome.  Denies any recent panic attacks.  Reports last manic episode was about 5 years ago.  Patient denies having psychotic symptoms since last discharge from the hospital, and attributed this to significantly decreased delta 8 use.     Past psychiatric history: Bipolar disorder, GAD, PTSD, reported history of ADHD This is the patient's third psychiatric hospitalization.  He does report a history of suicide attempts prior to this admission when he was 21 years old and drank bleach.  Past psychiatric medication history: Seroquel, lamotrigine, Adderall, lithium, Abilify, gabapentin   Past medical history: Neck pain after ski accident, patient reports gabapentin was helpful for this but is no longer taking due to not being able to find a PCP.  Patient was offered to restart  the gabapentin but declined.  Denies seizure history.  Denies surgical history.  NKDA.   Family history: Cousin  diagnosed with schizophrenia.  Family history of suicide: Cousin that was diagnosed with schizophrenia committed suicide.   Social history: Born and raised in [place].  New to [facility] around the age of 64.  Has girlfriend.  No children.  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   Substance use: Uses delta 8 about once per week, reports decrease dose 8 use since last admission.  Rare alcohol use reported.  Denies nicotine or tobacco use.  Denies recent marijuana use.  Denies other illicit drug use.     Continued Clinical Symptoms:    The "Alcohol Use Disorders Identification Test", Guidelines for Use in Primary Care, Second Edition.  [employer] [facility]). Score between 0-7:  no or low risk or alcohol related problems. Score between 8-15:  moderate risk of alcohol related problems. Score between 16-19:  high risk of alcohol related problems. Score 20 or above:  warrants further diagnostic evaluation for alcohol dependence and treatment.   CLINICAL FACTORS:   Severe Anxiety and/or Agitation Bipolar Disorder:   Depressive phase Alcohol/Substance Abuse/Dependencies More than one psychiatric diagnosis Unstable or Poor Therapeutic Relationship Previous Psychiatric Diagnoses and Treatments    Psychiatric Specialty Exam:  Presentation  General Appearance: Casual  Eye Contact:Fair  Speech:Normal Rate  Speech Volume:Normal  Handedness:Right   Mood and Affect  Mood:Anxious; Depressed; Hopeless  Affect:Congruent; Full Range   Thought Process  Thought Processes:Linear  Descriptions of Associations:Intact  Orientation:Full (Time, Place and Person)  Thought Content:Logical  History of Schizophrenia/Schizoaffective disorder:No  Duration of Psychotic Symptoms:Less than six months  Hallucinations:Hallucinations: None  Ideas of Reference:None  Suicidal Thoughts:Suicidal Thoughts: Yes, Passive SI Passive Intent and/or Plan: Without Intent; Without Plan  Homicidal  Thoughts:Homicidal Thoughts: No   Sensorium  Memory:Immediate Good; Recent Good; Remote Good  Judgment:Impaired  Insight:Fair   Executive Functions  Concentration:Fair  Attention Span:Fair  Recall:Good  Fund of Knowledge:Good  Language:Good   Psychomotor Activity  Psychomotor Activity:Psychomotor Activity: Normal   Assets  Assets:Communication Skills; Desire for Improvement; Financial Resources/Insurance; Housing; Intimacy; Resilience; Social Support; Transportation   Sleep  Sleep:Sleep: Fair    Physical Exam: Physical Exam See H&P  ROS See H&P  Blood pressure (!) 145/65, pulse 95, temperature 98.6 F (37 C), temperature source Oral, resp. rate 18, height 6' (1.829 m), weight 92.9 kg, SpO2 95 %. Body mass index is 27.78 kg/m.   COGNITIVE FEATURES THAT CONTRIBUTE TO RISK:  None    SUICIDE RISK:   Moderate:  Frequent suicidal ideation with limited intensity, and duration, some specificity in terms of plans, no associated intent, good self-control, limited dysphoria/symptomatology, some risk factors present, and identifiable protective factors, including available and accessible social support.  PLAN OF CARE: See H&P for assessment, diagnosis list and plan.   I certify that inpatient services furnished can reasonably be expected to improve the patient's condition.   Christoper Allegra, MD 12/07/2021, 4:21 PM
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
## 2021-12-07 NOTE — ED Provider Notes (Signed)
Emergency Medicine Observation Re-evaluation Note  Devon Lopez is a 21 y.o. male, seen on rounds today.  Pt initially presented to the ED for complaints of Suicide Attempt Including intentional lacerations with arterial attempt.  Highly concerning series of events including suicide letter. Currently, the patient is hemodynamically stable in no acute distress.  Physical Exam  BP (!) 171/90   Pulse 82   Temp 97.8 F (36.6 C) (Oral)   Resp 20   Ht 6' (1.829 m)   Wt 93.9 kg   SpO2 93%   BMI 28.07 kg/m  Physical Exam General: Appears to be resting comfortably in bed, no acute distress. Cardiac: Regular rate, normal heart rate, non-emergent blood pressure for this morning's vitals. Lungs: No increased work of breathing.  Equal chest rise appreciated Psych: Calm, asleep in bed.   ED Course / MDM  EKG:   I have reviewed the labs performed to date as well as medications administered while in observation.  Recent changes in the last 24 hours include initiation of psychiatric hold protocol.  Plan  Current plan is for psychiatric evaluation this morning.  Based upon reported history, likely patient will require admission given concerning significant efforts at suicidality as reported in initial history.    Tretha Sciara, MD 12/07/21 [id number]
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
## 2021-12-07 NOTE — H&P (Signed)
Psychiatric Admission Assessment Adult  Patient Identification: Devon Lopez MRN:  [id number] Date of Evaluation:  12/07/2021 Chief Complaint:  Bipolar 1 disorder, depressed (HCC) [F31.9] Principal Diagnosis: Bipolar 2 disorder, major depressive episode (HCC) Diagnosis:  Principal Problem:   Bipolar 2 disorder, major depressive episode (HCC) Active Problems:   Chronic post-traumatic stress disorder (PTSD)   Delta-9-tetrahydrocannabinol (THC) dependence (HCC)   GAD (generalized anxiety disorder)  History of Present Illness:  Patient is a 21 year old male with a past psychiatric history of bipolar disorder, GAD, PTSD, and (reported) ADHD, who was admitted to the psychiatric unit from [facility] emergency department after suicide attempt via cutting his wrists and arms while drinking alcohol.    Prior to admission psychiatric medications: None (Stopped taking around July 2023)  On my assessment today, the patient reports that he stopped taking prescribed psychiatric his medications about 1 month after most recent discharge from this hospital.  He reports Abilify was causing him to feel akathetic, so he stopped this medication.  He also reports stopping gabapentin after he was unable to find a PCP to follow up with continue prescribing the gabapentin.  Patient reports predominantly depressive symptoms since discharge from hospital last time.  Denies manic episodes since last discharge.  He reports multiple psychosocial stress contributing to worsening depression and suicidal thoughts including chronic pain, worsening self-esteem, not be able to find a full-time job due to chronic pain chronic daytime fatigue.  Patient states that he had become so overwhelmed with his inability to provide for himself or hold a job that he started to have thoughts that he was not good enough for his girlfriend and not good enough to be living, and that she would be better off without him and as well.  He reports  suicidal thoughts intensified to the day of suicide attempt where he drank wine, and cut himself on bilateral arms and wrist with a razor.  The patient reports depressed mood, pervasive sadness, and anhedonia for many months.  Works feeling hopeless.  Reports he feels tired all the time due to his nerve issues and depression.  Reports that sleep is okay, without difficulty initiating or maintaining sleep.  Reports he does not snore.  Reports that appetite is decreased.  Reports continued have suicidal thoughts, passive, without any intent or plan at this time.  Denies HI.  Reports that anxiety is generalized, excessive, bothersome.  Denies any recent panic attacks.  Reports last manic episode was about 5 years ago.  Patient denies having psychotic symptoms since last discharge from the hospital, and attributed this to significantly decreased delta 8 use.    Past psychiatric history: Bipolar disorder, GAD, PTSD, reported history of ADHD This is the patient's third psychiatric hospitalization.  He does report a history of suicide attempts prior to this admission when he was 21 years old and drank bleach.  Past psychiatric medication history: Seroquel, lamotrigine, Adderall, lithium, Abilify, gabapentin  Past medical history: Neck pain after ski accident, patient reports gabapentin was helpful for this but is no longer taking due to not being able to find a PCP.  Patient was offered to restart the gabapentin but declined.  Denies seizure history.  Denies surgical history.  NKDA.  Family history: Cousin diagnosed with schizophrenia.  Family history of suicide: Cousin that was diagnosed with schizophrenia committed suicide.  Social history: Born and raised in [place].  New to [facility] around the age of 54.  Has girlfriend.  No children.  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  Substance use: Uses delta 8 about once per week, reports decrease dose 8 use since last admission.  Rare alcohol use reported.  Denies nicotine  or tobacco use.  Denies recent marijuana use.  Denies other illicit drug use.    Total Time spent with patient: 30 minutes    Is the patient at risk to self? Yes.    Has the patient been a risk to self in the past 6 months? Yes.    Has the patient been a risk to self within the distant past? Yes.    Is the patient a risk to others? No.  Has the patient been a risk to others in the past 6 months? No.  Has the patient been a risk to others within the distant past? No.   [place] Scale:  Flowsheet Row [facility]) [facility]/2021 in [facility] Ad[facility] (Discharged) [facility]  C-SSRS RISK CATEGORY No Risk High Risk Error: Q3, 4, or 5 should not be populated when Q2 is No        Prior Inpatient Therapy:   Prior Outpatient Therapy:    Alcohol Screening:   Substance Abuse History in the last 12 months:  Yes.   Consequences of Substance Abuse: Medical Consequences:  psychiatric decline Previous Psychotropic Medications: Yes  Psychological Evaluations: Yes  Past Medical History:  Past Medical History:  Diagnosis Date   Anxiety    Bipolar 2 disorder (HCC)    Depression    PTSD (post-traumatic stress disorder)    Suicide attempt (HCC)    TBI (traumatic brain injury) (HCC) 2016   History reviewed. No pertinent surgical history. Family History: History reviewed. No pertinent family history.  Tobacco Screening:  denies   Social History:  Social History   Substance and Sexual Activity  Alcohol Use Never     Social History   Substance and Sexual Activity  Drug Use Yes   Types: Marijuana    Additional Social History:                           Allergies:   Allergies  Allergen Reactions   Banana Diarrhea   Pineapple Diarrhea   Lab Results:  Results for orders placed or performed during the hospital  encounter of 12/06/21 (from the past 48 hour(s))  Comprehensive metabolic panel     Status: None   Collection Time: 12/06/21 10:53 PM  Result Value Ref Range   Sodium 138 135 - 145 mmol/L   Potassium 3.7 3.5 - 5.1 mmol/L   Chloride 107 98 - 111 mmol/L   CO2 23 22 - 32 mmol/L   Glucose, Bld 86 70 - 99 mg/dL    Comment: Glucose reference range applies only to samples taken after fasting for at least 8 hours.   BUN 12 6 - 20 mg/dL   Creatinine, Ser 3.84 0.61 - 1.24 mg/dL   Calcium 9.2 8.9 - 66.5 mg/dL   Total Protein 7.2 6.5 - 8.1 g/dL   Albumin 4.2 3.5 - 5.0 g/dL   AST 24 15 - 41 U/L   ALT 34 0 - 44 U/L   Alkaline Phosphatase 59 38 - 126 U/L   Total Bilirubin 0.5 0.3 - 1.2 mg/dL   GFR, Estimated >99 >35 mL/min    Comment: (NOTE) Calculated using the CKD-EPI Creatinine Equation (2021)    Anion  gap 8 5 - 15    Comment: Performed at [facility], [address], [place], [place] [address]  Ethanol     Status: None   Collection Time: 12/06/21 10:53 PM  Result Value Ref Range   Alcohol, Ethyl (B) <10 <10 mg/dL    Comment: (NOTE) Lowest detectable limit for serum alcohol is 10 mg/dL.  For medical purposes only. Performed at [facility], [address], [place], [place] [address]   Salicylate level     Status: Abnormal   Collection Time: 12/06/21 10:53 PM  Result Value Ref Range   Salicylate Lvl <7.0 (L) 7.0 - 30.0 mg/dL    Comment: Performed at [facility], [address], [place], [place] [address]  Acetaminophen level     Status: Abnormal   Collection Time: 12/06/21 10:53 PM  Result Value Ref Range   Acetaminophen (Tylenol), Serum <10 (L) 10 - 30 ug/mL    Comment: (NOTE) Therapeutic concentrations vary significantly. A range of 10-30 ug/mL  may be an effective concentration for many patients. However, some  are best treated at concentrations outside of this range. Acetaminophen concentrations >150 ug/mL at 4 hours  after ingestion  and >50 ug/mL at 12 hours after ingestion are often associated with  toxic reactions.  Performed at [facility], [address] [place], [place] [address]   cbc     Status: Abnormal   Collection Time: 12/06/21 10:53 PM  Result Value Ref Range   WBC 8.8 4.0 - 10.5 K/uL   RBC 5.54 4.22 - 5.81 MIL/uL   Hemoglobin 14.7 13.0 - 17.0 g/dL   HCT 29.5 62.1 - 30.8 %   MCV 79.8 (L) 80.0 - 100.0 fL   MCH 26.5 26.0 - 34.0 pg   MCHC 33.3 30.0 - 36.0 g/dL   RDW 65.7 84.6 - 96.2 %   Platelets 259 150 - 400 K/uL   nRBC 0.0 0.0 - 0.2 %    Comment: Performed at [facility], [address], [place], [place] [address]  Acetaminophen level     Status: Abnormal   Collection Time: 12/07/21  6:14 AM  Result Value Ref Range   Acetaminophen (Tylenol), Serum <10 (L) 10 - 30 ug/mL    Comment: (NOTE) Therapeutic concentrations vary significantly. A range of 10-30 ug/mL  may be an effective concentration for many patients. However, some  are best treated at concentrations outside of this range. Acetaminophen concentrations >150 ug/mL at 4 hours after ingestion  and >50 ug/mL at 12 hours after ingestion are often associated with  toxic reactions.  Performed at [facility], [address] [place], [place] [address]   Resp Panel by RT-PCR (Flu A&B, Covid) Anterior Nasal Swab     Status: None   Collection Time: 12/07/21  6:14 AM   Specimen: Anterior Nasal Swab  Result Value Ref Range   SARS Coronavirus 2 by RT PCR NEGATIVE NEGATIVE    Comment: (NOTE) SARS-CoV-2 target nucleic acids are NOT DETECTED.  The SARS-CoV-2 RNA is generally detectable in upper respiratory specimens during the acute phase of infection. The lowest concentration of SARS-CoV-2 viral copies this assay can detect is 138 copies/mL. A negative result does not preclude SARS-Cov-2 infection and should not be used as the sole basis for treatment or other  patient management decisions. A negative result may occur with  improper specimen collection/handling, submission of specimen other than nasopharyngeal swab, presence of viral mutation(s)  within the areas targeted by this assay, and inadequate number of viral copies(<138 copies/mL). A negative result must be combined with clinical observations, patient history, and epidemiological information. The expected result is Negative.  Fact Sheet for Patients:  BloggerCourse.com  Fact Sheet for Healthcare Providers:  SeriousBroker.it  This test is no t yet approved or cleared by the [place] FDA and  has been authorized for detection and/or diagnosis of SARS-CoV-2 by FDA under an Emergency Use Authorization (EUA). This EUA will remain  in effect (meaning this test can be used) for the duration of the COVID-19 declaration under Section 564(b)(1) of the Act, 21 U.S.C.section 360bbb-3(b)(1), unless the authorization is terminated  or revoked sooner.       Influenza A by PCR NEGATIVE NEGATIVE   Influenza B by PCR NEGATIVE NEGATIVE    Comment: (NOTE) The Xpert Xpress SARS-CoV-2/FLU/RSV plus assay is intended as an aid in the diagnosis of influenza from Nasopharyngeal swab specimens and should not be used as a sole basis for treatment. Nasal washings and aspirates are unacceptable for Xpert Xpress SARS-CoV-2/FLU/RSV testing.  Fact Sheet for Patients: BloggerCourse.com  Fact Sheet for Healthcare Providers: SeriousBroker.it  This test is not yet approved or cleared by the [place] FDA and has been authorized for detection and/or diagnosis of SARS-CoV-2 by FDA under an Emergency Use Authorization (EUA). This EUA will remain in effect (meaning this test can be used) for the duration of the COVID-19 declaration under Section 564(b)(1) of the Act, 21 U.S.C. section 360bbb-3(b)(1), unless  the authorization is terminated or revoked.  Performed at [facility], [address] [place], [place] [address]     Blood Alcohol level:  Lab Results  Component Value Date   ETH <10 12/06/2021   ETH <10 08/19/2021    Metabolic Disorder Labs:  Lab Results  Component Value Date   HGBA1C 5.2 08/21/2021   MPG 102.54 08/21/2021   No results found for: "PROLACTIN" Lab Results  Component Value Date   CHOL 169 08/23/2021   TRIG 251 (H) 08/23/2021   HDL 33 (L) 08/23/2021   CHOLHDL 5.1 08/23/2021   VLDL 50 (H) 08/23/2021   LDLCALC 86 08/23/2021   LDLCALC UNABLE TO CALCULATE IF TRIGLYCERIDE OVER 400 mg/dL 21/19/4174    Current Medications: Current Facility-Administered Medications  Medication Dose Route Frequency Provider Last Rate Last Admin   acetaminophen (TYLENOL) tablet 650 mg  650 mg Oral Q6H PRN Jackelyn Poling, NP       alum & mag hydroxide-simeth (MAALOX/MYLANTA) 200-200-20 MG/5ML suspension 30 mL  30 mL Oral Q4H PRN Jackelyn Poling, NP       hydrOXYzine (ATARAX) tablet 25 mg  25 mg Oral TID PRN Jackelyn Poling, NP       [START ON 12/08/2021] influenza vac split quadrivalent PF (FLUARIX) injection 0.5 mL  0.5 mL Intramuscular Tomorrow-1000 Tamon Parkerson, MD       magnesium hydroxide (MILK OF MAGNESIA) suspension 30 mL  30 mL Oral Daily PRN Jackelyn Poling, NP       [START ON 12/08/2021] pneumococcal 20-valent conjugate vaccine (PREVNAR 20) injection 0.5 mL  0.5 mL Intramuscular Tomorrow-1000 Tonja Jezewski, MD       PTA Medications: Medications Prior to Admission  Medication Sig Dispense Refill Last Dose   ARIPiprazole (ABILIFY) 10 MG tablet Take 1 tablet (10 mg total) by mouth daily. (Patient not taking: Reported on 12/07/2021) 30 tablet 0    gabapentin (NEURONTIN) 100 MG capsule Take 2  capsules (200 mg total) by mouth 3 (three) times daily. (Patient not taking: Reported on 12/07/2021) 180 capsule 0     Musculoskeletal: Strength & Muscle Tone:  within normal limits Gait & Station: normal Patient leans: N/A            Psychiatric Specialty Exam:  Presentation  General Appearance: Casual  Eye Contact:Fair  Speech:Normal Rate  Speech Volume:Normal  Handedness:Right   Mood and Affect  Mood:Anxious; Depressed; Hopeless  Affect:Congruent; Full Range   Thought Process  Thought Processes:Linear  Duration of Psychotic Symptoms: Less than six months  Past Diagnosis of Schizophrenia or Psychoactive disorder: No  Descriptions of Associations:Intact  Orientation:Full (Time, Place and Person)  Thought Content:Logical  Hallucinations:Hallucinations: None  Ideas of Reference:None  Suicidal Thoughts:Suicidal Thoughts: Yes, Passive SI Passive Intent and/or Plan: Without Intent; Without Plan  Homicidal Thoughts:Homicidal Thoughts: No   Sensorium  Memory:Immediate Good; Recent Good; Remote Good  Judgment:Impaired  Insight:Fair   Executive Functions  Concentration:Fair  Attention Span:Fair  Recall:Good  Fund of Knowledge:Good  Language:Good   Psychomotor Activity  Psychomotor Activity:Psychomotor Activity: Normal   Assets  Assets:Communication Skills; Desire for Improvement; Financial Resources/Insurance; Housing; Intimacy; Resilience; Social Support; Transportation   Sleep  Sleep:Sleep: Fair    Physical Exam: Physical Exam Vitals reviewed.  Pulmonary:     Effort: Pulmonary effort is normal.  Neurological:     Motor: No weakness.     Gait: Gait normal.    Review of Systems  Neurological:  Negative for dizziness, tingling, tremors and headaches.  Psychiatric/Behavioral:  Positive for depression, substance abuse and suicidal ideas. The patient is nervous/anxious.    Blood pressure (!) 145/65, pulse 95, temperature 98.6 F (37 C), temperature source Oral, resp. rate 18, height 6' (1.829 m), weight 92.9 kg, SpO2 95 %. Body mass index is 27.78 kg/m.  Treatment Plan  Summary: Daily contact with patient to assess and evaluate symptoms and progress in treatment  ASSESSMENT:  Diagnoses / Active Problems:  -Bipolar disorder, type II, current episode is depressed, without psychotic features -GAD -PTSD -Reported history of ADHD -Delta 8 use -Reported history of TBI  PLAN: Safety and Monitoring:  --  Voluntary admission to inpatient psychiatric unit for safety, stabilization and treatment  -- Daily contact with patient to assess and evaluate symptoms and progress in treatment  -- Patient's case to be discussed in multi-disciplinary team meeting  -- Observation Level : q15 minute checks  -- Vital signs:  q12 hours  -- Precautions: suicide, elopement, and assault  2. Psychiatric Diagnoses and Treatment:    -Start Geodon 20 mg twice daily with food-for bipolar disorder..  Patient had previously tried Seroquel, which caused too much sedation, and Abilify which caused akathisia. -Patient declined to restart gabapentin at this time due to not having PCP to continue this prescription after discharge.  Patient offered PCP referral. -Consider starting medication for chronic fatigue such as modafinil once patient is on a reasonable dose of a mood stabilizer.  Patient reports a lot of his depressive symptoms stem from chronic daytime fatigue and apathy.   --  The risks/benefits/side-effects/alternatives to this medication were discussed in detail with the patient and time was given for questions. The patient consents to medication trial.    -- Metabolic profile and EKG monitoring obtained while on an atypical antipsychotic (BMI: Lipid Panel: HbgA1c: QTc:)   -- Encouraged patient to participate in unit milieu and in scheduled group therapies   -- Short Term Goals: Ability to identify changes in  lifestyle to reduce recurrence of condition will improve, Ability to verbalize feelings will improve, Ability to disclose and discuss suicidal ideas, Ability to demonstrate  self-control will improve, Ability to identify and develop effective coping behaviors will improve, Ability to maintain clinical measurements within normal limits will improve, Compliance with prescribed medications will improve, and Ability to identify triggers associated with substance abuse/mental health issues will improve  -- Long Term Goals: Improvement in symptoms so as ready for discharge    3. Medical Issues Being Addressed:   Tobacco Use Disorder  -- Nicotine patch 21mg /24 hours ordered  -- Smoking cessation encouraged  4. Discharge Planning:   -- Social work and case management to assist with discharge planning and identification of hospital follow-up needs prior to discharge  -- Estimated LOS: 5-7 days  -- Discharge Concerns: Need to establish a safety plan; Medication compliance and effectiveness  -- Discharge Goals: Return home with outpatient referrals for mental health follow-up including medication management/psychotherapy    Observation Level/Precautions:  15 minute checks  Laboratory:    Psychotherapy:    Medications:    Consultations:    Discharge Concerns:    Estimated LOS: 5 to 7 days  Other:     Physician Treatment Plan for Primary Diagnosis: Bipolar 2 disorder, major depressive episode [facility](HCC)   Physician Treatment Plan for Secondary Diagnosis: Principal Problem:   Bipolar 2 disorder, major depressive episode (HCC) Active Problems:   Chronic post-traumatic stress disorder (PTSD)   Delta-9-tetrahydrocannabinol (THC) dependence (HCC)   GAD (generalized anxiety disorder)   I certify that inpatient services furnished can reasonably be expected to improve the patient's condition.    Cristy HiltsNathan W Melroy Bougher, MD 9/28/20234:17 PM  Total Time Spent in Direct Patient Care:  I personally spent 60 minutes on the unit in direct patient care. The direct patient care time included face-to-face time with the patient, reviewing the patient's chart, communicating with other  professionals, and coordinating care. Greater than 50% of this time was spent in counseling or coordinating care with the patient regarding goals of hospitalization, psycho-education, and discharge planning needs.   Phineas InchesNathan Mael Delap, MD Psychiatrist

## 2021-12-07 NOTE — Progress Notes (Addendum)
Update:  PT ACCEPTED TO [facility] TODAY 12/07/21-BED READY NOW pending vol consent recieved  Per Night Shift AC Wynonia Hazard, RN at 7:08am pt has been accepted to [facility]. Day Shift AC Lynnda Shields, RN provided bed accepting information; [phone] for pt to admit to [facility] 12/07/21.  Please fax vol consent form to [phone]. CSW advised from care team that fax has been received.  Pt will admit to [facility] [id number]. Accepting is Lindon Romp NP. Attending is Nelda Marseille MD Dx Bipolar affective DO, current episode depressed.   Care Team notified:Jason Gwenlyn Found, NP, Phineas Real Riddick-Minor, Adalberto Ill, RN, Rikki Spearing, RN, Irish Lack, RN. Luanna Salk, RN, Benjamine Sprague, RN, Lynnda Shields, RN, Edward Qualia, RN, Bobbe Medico, Wynonia Hazard, RN, Reymundo Poll, RN, Danton Clap, NT, Odetta Pink, [facility]  CSW made aware during 9am meeting of acceptance at [facility]. At 10:26am this CSW was added to care team communication about this pt's placement. CSW then updated chart and confirmed with care team.   Inpatient Behavioral Health Placement  Pt meets inpatient criteria per Erasmo Score, NP. There are no available beds at [facility].  Referral was sent to the following facilities;  Destination Service Provider Address Phone Fax  [facility]  [place], [place] [address] [place]  [facility]  [address] [place] [address] [phone]  [facility]  [place], [place] [address] [phone]  [facility]  [place] [address] [place] [address] [place]  [facility]  [address], [place] [address] [phone]  [place] [address], [place] [address] [phone]  [facility]  [address] [place] [address] [phone]   [facility]  [address] [place] [address] [phone] [facility]  [address], [place] [address] [phone]  [facility]  [address] [place] [address] [phone]  [facility]  [address] [place] [address] [phone]     Situation ongoing,  CSW will follow up.   Benjaman Kindler, MSW, LCSWA 12/07/2021  @ 8:39 AM

## 2021-12-08 ENCOUNTER — Encounter (HOSPITAL_COMMUNITY): Payer: Self-pay

## 2021-12-08 LAB — LIPID PANEL
Cholesterol: 204 mg/dL — ABNORMAL HIGH (ref 0–200)
HDL: 32 mg/dL — ABNORMAL LOW (ref >40)
LDL Cholesterol: 105 mg/dL — ABNORMAL HIGH (ref 0–99)
Total CHOL/HDL Ratio: 6.4 RATIO
Triglycerides: 335 mg/dL — ABNORMAL HIGH (ref <150)
VLDL: 67 mg/dL — ABNORMAL HIGH (ref 0–40)

## 2021-12-08 LAB — HEMOGLOBIN A1C
Hgb A1c MFr Bld: 5.2 % (ref 4.8–5.6)
Mean Plasma Glucose: 102.54 mg/dL

## 2021-12-08 LAB — RAPID URINE DRUG SCREEN, HOSP PERFORMED
Amphetamines: NOT DETECTED
Barbiturates: NOT DETECTED
Benzodiazepines: NOT DETECTED
Cocaine: NOT DETECTED
Opiates: NOT DETECTED
Tetrahydrocannabinol: POSITIVE — AB

## 2021-12-08 LAB — TSH: TSH: 2.827 u[IU]/mL (ref 0.350–4.500)

## 2021-12-08 MED ORDER — MODAFINIL 100 MG PO TABS
100.0000 mg | ORAL_TABLET | Freq: Every day | ORAL | Status: DC
  Administered 2021-12-09 – 2021-12-13 (×5): 100 mg via ORAL
  Filled 2021-12-08 (×5): qty 1

## 2021-12-08 MED ORDER — ZIPRASIDONE HCL 40 MG PO CAPS
40.0000 mg | ORAL_CAPSULE | Freq: Two times a day (BID) | ORAL | Status: DC
  Administered 2021-12-08 – 2021-12-11 (×6): 40 mg via ORAL
  Filled 2021-12-08 (×11): qty 1

## 2021-12-08 NOTE — Progress Notes (Addendum)
[facility]  12/08/2021 4:21 PM Devon NashWilliam Lopez  MRN:  [id number]  Subjective:   Patient is a 21 year old male with a past psychiatric history of bipolar disorder, GAD, PTSD, and (reported) ADHD, who was admitted to the psychiatric unit [facility] after suicide attempt via cutting his wrists and arms while drinking alcohol.    Yesterday the team made the following recommendations: -Start Geodon 20 mg twice daily with meals  On my assessment today, the patient reports feeling less depressed.  Reports feeling upset about a speeding ticket that he forgot to pay, that is due tomorrow.  We discussed plans for his girlfriend to help him pay this as he is in the hospital at this time.  He reports daytime fatigue continues, this is chronic.  Reports that sleep was okay last night.  Reports appetite is okay.  Denies having any suicidal thoughts today which is an improvement.  Denies HI.  Denies other psychotic symptoms.  Overall he reports tolerating Geodon well and is agreeable to increasing the dose.  He is still interested in starting medication for daytime fatigue, which he reports prevents him from being able to complete tasks and hold a job outside the hospital, in addition to his chronic neck pain.  He is agreeable to starting modafinil and denies cardiac problems.   Reports changing own dressings and denies any signs of infection of lacerations.    Principal Problem: Bipolar 2 disorder, major depressive episode (HCC) Diagnosis: Principal Problem:   Bipolar 2 disorder, major depressive episode (HCC) Active Problems:   Chronic post-traumatic stress disorder (PTSD)   Delta-9-tetrahydrocannabinol (THC) dependence (HCC)   GAD (generalized anxiety disorder)  Total Time spent with patient: 20 minutes  Past Psychiatric History:  Bipolar disorder, GAD, PTSD, reported history of ADHD This is the patient's third psychiatric hospitalization.  He does report a  history of suicide attempts prior to this admission when he was 21 years old and drank bleach.  Past psychiatric medication history: Seroquel, lamotrigine, Adderall, lithium, Abilify, gabapentin  Past Medical History:  Past Medical History:  Diagnosis Date   Anxiety    Bipolar 2 disorder (HCC)    Depression    PTSD (post-traumatic stress disorder)    Suicide attempt (HCC)    TBI (traumatic brain injury) (HCC) 2016   History reviewed. No pertinent surgical history. Family History: History reviewed. No pertinent family history. Family Psychiatric  History:  Cousin diagnosed with schizophrenia.  Family history of suicide: Cousin that was diagnosed with schizophrenia committed suicide. Social History:  Social History   Substance and Sexual Activity  Alcohol Use Never     Social History   Substance and Sexual Activity  Drug Use Yes   Types: Marijuana    Social History   Socioeconomic History   Marital status: Single    Spouse name: Not on file   Number of children: Not on file   Years of education: Not on file   Highest education level: Not on file  Occupational History   Not on file  Tobacco Use   Smoking status: Never   Smokeless tobacco: Never  Vaping Use   Vaping Use: Never used  Substance and Sexual Activity   Alcohol use: Never   Drug use: Yes    Types: Marijuana   Sexual activity: Not Currently  Other Topics Concern   Not on file  Social History Narrative   Not on file   Social Determinants of Health   Financial Resource  Strain: Not on file  Food Insecurity: No Food Insecurity (12/07/2021)   Hunger Vital Sign    Worried About Running Out of Food in the Last Year: Never true    Ran Out of Food in the Last Year: Never true  Transportation Needs: No Transportation Needs (12/07/2021)   PRAPARE - Administrator, Civil Service (Medical): No    Lack of Transportation (Non-Medical): No  Physical Activity: Not on file  Stress: Not on file  Social  Connections: Not on file   Additional Social History:                         Sleep: Fair  Appetite:  Fair  Current Medications: Current Facility-Administered Medications  Medication Dose Route Frequency Provider Last Rate Last Admin   acetaminophen (TYLENOL) tablet 650 mg  650 mg Oral Q6H PRN Jackelyn Poling, NP       alum & mag hydroxide-simeth (MAALOX/MYLANTA) 200-200-20 MG/5ML suspension 30 mL  30 mL Oral Q4H PRN Jackelyn Poling, NP       hydrOXYzine (ATARAX) tablet 25 mg  25 mg Oral TID PRN Jackelyn Poling, NP       magnesium hydroxide (MILK OF MAGNESIA) suspension 30 mL  30 mL Oral Daily PRN Jackelyn Poling, NP       [START ON 12/09/2021] modafinil (PROVIGIL) tablet 100 mg  100 mg Oral Daily Byran Bilotti, MD       neomycin-bacitracin-polymyxin (NEOSPORIN) ointment   Topical BID Phineas Inches, MD   Given at 12/07/21 2143   traZODone (DESYREL) tablet 50 mg  50 mg Oral QHS PRN Murat Rideout, Harrold Donath, MD       ziprasidone (GEODON) capsule 40 mg  40 mg Oral BID WC Carely Nappier, Harrold Donath, MD        Lab Results:  Results for orders placed or performed during the hospital encounter of 12/07/21 (from the past 48 hour(s))  Rapid urine drug screen (hospital performed)     Status: Abnormal   Collection Time: 12/07/21  7:48 PM  Result Value Ref Range   Opiates NONE DETECTED NONE DETECTED   Cocaine NONE DETECTED NONE DETECTED   Benzodiazepines NONE DETECTED NONE DETECTED   Amphetamines NONE DETECTED NONE DETECTED   Tetrahydrocannabinol POSITIVE (A) NONE DETECTED   Barbiturates NONE DETECTED NONE DETECTED    Comment: (NOTE) DRUG SCREEN FOR MEDICAL PURPOSES ONLY.  IF CONFIRMATION IS NEEDED FOR ANY PURPOSE, NOTIFY LAB WITHIN 5 DAYS.  LOWEST DETECTABLE LIMITS FOR URINE DRUG SCREEN Drug Class                     Cutoff (ng/mL) Amphetamine and metabolites    1000 Barbiturate and metabolites    200 Benzodiazepine                 200 Tricyclics and metabolites     300 Opiates  and metabolites        300 Cocaine and metabolites        300 THC                            50 Performed at [facility], [address] [place], [place] [address]   Hemoglobin A1c     Status: None   Collection Time: 12/08/21  7:00 AM  Result Value Ref Range   Hgb A1c MFr Bld 5.2 4.8 - 5.6 %  Comment: (NOTE) Pre diabetes:          5.7%-6.4%  Diabetes:              >6.4%  Glycemic control for   <7.0% adults with diabetes    Mean Plasma Glucose 102.54 mg/dL    Comment: Performed at [facility], [address] [place], [place] [address]  Lipid panel     Status: Abnormal   Collection Time: 12/08/21  7:00 AM  Result Value Ref Range   Cholesterol 204 (H) 0 - 200 mg/dL   Triglycerides 992 (H) <150 mg/dL   HDL 32 (L) >42 mg/dL   Total CHOL/HDL Ratio 6.4 RATIO   VLDL 67 (H) 0 - 40 mg/dL   LDL Cholesterol 683 (H) 0 - 99 mg/dL    Comment:        Total Cholesterol/HDL:CHD Risk Coronary Heart Disease Risk Table                     Men   Women  1/2 Average Risk   3.4   3.3  Average Risk       5.0   4.4  2 X Average Risk   9.6   7.1  3 X Average Risk  23.4   11.0        Use the calculated Patient Ratio above and the CHD Risk Table to determine the patient's CHD Risk.        ATP III CLASSIFICATION (LDL):  <100     mg/dL   Optimal  419-622  mg/dL   Near or Above                    Optimal  130-159  mg/dL   Borderline  297-989  mg/dL   High  >211     mg/dL   Very High Performed at [facility], [address], [place], [place] [address]   TSH     Status: None   Collection Time: 12/08/21  7:00 AM  Result Value Ref Range   TSH 2.827 0.350 - 4.500 uIU/mL    Comment: Performed by a 3rd Generation assay with a functional sensitivity of <=0.01 uIU/mL. Performed at [facility], [address], [place], [place] [address]     Blood Alcohol level:  Lab Results  Component Value Date   ETH <10 12/06/2021   ETH <10  08/19/2021    Metabolic Disorder Labs: Lab Results  Component Value Date   HGBA1C 5.2 12/08/2021   MPG 102.54 12/08/2021   MPG 102.54 08/21/2021   No results found for: "PROLACTIN" Lab Results  Component Value Date   CHOL 204 (H) 12/08/2021   TRIG 335 (H) 12/08/2021   HDL 32 (L) 12/08/2021   CHOLHDL 6.4 12/08/2021   VLDL 67 (H) 12/08/2021   LDLCALC 105 (H) 12/08/2021   LDLCALC 86 08/23/2021    Physical Findings: AIMS:  , ,  ,  ,    CIWA:  CIWA-Ar Total: 2 COWS:     Musculoskeletal: Strength & Muscle Tone: within normal limits Gait & Station: normal Patient leans: N/A  Psychiatric Specialty Exam:  Presentation  General Appearance:  Casual  Eye Contact: Fair  Speech: Normal Rate  Speech Volume: Normal  Handedness: Right   Mood and Affect  Mood: Anxious; less depressed  Affect: Congruent; Full Range   Thought Process  Thought Processes: Linear  Descriptions of Associations:Intact  Orientation:Full (Time, Place and Person)  Thought Content:Logical  History of Schizophrenia/Schizoaffective disorder:No  Duration of Psychotic Symptoms: NA  Hallucinations:Hallucinations: None  Ideas of Reference:None  Suicidal Thoughts:Suicidal Thoughts: Denies  Homicidal Thoughts:Homicidal Thoughts: No   Sensorium  Memory: Immediate Good; Recent Good; Remote Good  Judgment: Impaired  Insight: Fair   Materials engineer: Fair  Attention Span: Fair  Recall: Good  Fund of Knowledge: Good  Language: Good   Psychomotor Activity  Psychomotor Activity: Psychomotor Activity: Normal No EPS on exam today.  Denies having any akathisia.  No akathisia observed.  Assets  Assets: Armed forces logistics/support/administrative officer; Desire for Improvement; Financial Resources/Insurance; Housing; Intimacy; Resilience; Social Support; Transportation   Sleep  Sleep: Sleep: Fair    Physical Exam: Physical Exam Vitals reviewed.  Pulmonary:      Effort: Pulmonary effort is normal.  Neurological:     Motor: No weakness.     Gait: Gait normal.    Review of Systems  Neurological:  Negative for dizziness and tremors.  Psychiatric/Behavioral:  Positive for depression and substance abuse. Negative for hallucinations, memory loss and suicidal ideas. The patient is nervous/anxious. The patient does not have insomnia.    Blood pressure 127/71, pulse 72, temperature 97.6 F (36.4 C), temperature source Oral, resp. rate 18, height 6' (1.829 m), weight 92.9 kg, SpO2 98 %. Body mass index is 27.78 kg/m.   Treatment Plan Summary:   Diagnoses / Active Problems:   -Bipolar disorder, type II, current episode is depressed, without psychotic features -GAD -PTSD -Reported history of ADHD -Delta 8 use -Reported history of TBI   PLAN: Safety and Monitoring:             --  Voluntary admission to inpatient psychiatric unit for safety, stabilization and treatment             -- Daily contact with patient to assess and evaluate symptoms and progress in treatment             -- Patient's case to be discussed in multi-disciplinary team meeting             -- Observation Level : q15 minute checks             -- Vital signs:  q12 hours             -- Precautions: suicide, elopement, and assault   2. Psychiatric Diagnoses and Treatment:               -Increase Geodon to 40 mg twice daily with food-for bipolar disorder..  Patient had previously tried Seroquel, which caused too much sedation, and Abilify which caused akathisia. -Patient declined to restart gabapentin at this time due to not having PCP to continue this prescription after discharge.  Patient offered PCP referral. -Start modafinil 100 mg once daily tomorrow for chronic fatigue.  We discussed this is evidence-based for treating depression and low energy in patients with bipolar mood disorder but is not FDA approved for this.  We discussed risks versus benefits of starting this  medication, the patient has given verbal consent to start this medication.  He denies any cardiac history.  June 2023 EKG reviewed.  --  The risks/benefits/side-effects/alternatives to this medication were discussed in detail with the patient and time was given for questions. The patient consents to medication trial.                -- Metabolic profile and EKG monitoring obtained while on an atypical antipsychotic (BMI: Lipid Panel: HbgA1c: QTc:)              --  Encouraged patient to participate in unit milieu and in scheduled group therapies                            3. Medical Issues Being Addressed:          4. Discharge Planning:              -- Social work and case management to assist with discharge planning and identification of hospital follow-up needs prior to discharge             -- Estimated LOS: 5-7 days             -- Discharge Concerns: Need to establish a safety plan; Medication compliance and effectiveness             -- Discharge Goals: Return home with outpatient referrals for mental health follow-up including medication management/psychotherapy     Cristy Hilts, MD 12/08/2021, 4:21 PM  Total Time Spent in Direct Patient Care:  I personally spent 35 minutes on the unit in direct patient care. The direct patient care time included face-to-face time with the patient, reviewing the patient's chart, communicating with other professionals, and coordinating care. Greater than 50% of this time was spent in counseling or coordinating care with the patient regarding goals of hospitalization, psycho-education, and discharge planning needs.   Phineas Inches, MD Psychiatrist

## 2021-12-08 NOTE — Group Note (Signed)
Recreation Therapy Group Note   Group Topic:Healthy Decision Making  Group Date: 12/08/2021 Start Time: 0930 End Time: 1000 Facilitators: Bobby Ragan-McCall, LRT,CTRS Location: [address] Dayroom   Goal Area(s) Addresses:  Patient will effectively work with peer towards shared goal.  Patient will identify factors that guided their decision making.  Patient will pro-socially communicate ideas during group session.   Group Description:  Patients were given a scenario that they were going to be stranded on a deserted [place] for several months before being rescued. Writer tasked them with making a list of 15 things they would choose to bring with them for "survival". The list of items was prioritized most important to least. Each patient would come up with their own list, then work together to create a new list of 15 items while in a group of 3-5 peers. LRT discussed each person's list and how it differed from others. The debrief included discussion of priorities, good decisions versus bad decisions, and how it is important to think before acting so we can make the best decision possible. LRT tied the concept of effective communication among group members to patient's support systems outside of the hospital and its benefit post discharge.   Affect/Mood: Appropriate   Participation Level: Engaged   Participation Quality: Independent   Behavior: Appropriate   Speech/Thought Process: Focused   Insight: Good   Judgement: Good   Modes of Intervention: Activity   Patient Response to Interventions:  Engaged   Education Outcome:  Acknowledges education and In group clarification offered    Clinical Observations/Individualized Feedback: Pt was engaged and bright throughout group session.  Pt was also very involved in creating the final list for the group.     Plan: Continue to engage patient in RT group sessions 2-3x/week.   Jacquan Savas-McCall, LRT,CTRS 12/08/2021 1:15 PM

## 2021-12-08 NOTE — Group Note (Signed)
LCSW Group Therapy Note   Group Date: 12/08/2021 Start Time: 1300 End Time: 1400  Type of Therapy and Topic:  Group Therapy:  Stress Management   Participation Level:  Active    Description of Group:  Patients in this group were introduced to the idea of stress and encouraged to discuss negative and positive ways to manage stress. Patients discussed specific stressors that they have in their life right now and the physical signs and symptoms associated with that stress.  Patient encouraged to come up with positive changes to assist with the stress upon discharge in order to prevent future hospitalizations.   They also worked as a group on developing a specific plan for several patients to deal with stressors through boundary-setting, psychoeducation and self-care techniques   Therapeutic Goals:               1)  To discuss the positive and negative impacts of stress             2)  identify signs and symptoms of stress             3)  generate ideas for stress management             4)  offer mutual support to others regarding stress management             5)  Developing plans for ways to manage specific stressors upon discharge               Summary of Patient Progress: The Pt attended group and remained there the entire time.  The Pt accepted all worksheets and participated in the group discussion.  The Pt was appropriate with their peers and was able to share examples of stress in their own life.  The Pt was able to find new ways of managing their stress and having a different perspective about things that cause them stress.    Therapeutic Modalities:   Motivational Interviewing Brief Solution-Focused Therapy  Darleen Crocker, Latanya Presser 12/08/2021  1:56 PM
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
## 2021-12-08 NOTE — Group Note (Signed)
Date:  12/08/2021 Time:  6:53 PM  Group Topic/Focus:  Dimensions of Wellness:   The focus of this group is to introduce the topic of wellness and discuss the role each dimension of wellness plays in total health.    Participation Level:  Active  Participation Quality:  Appropriate  Affect:  Appropriate  Cognitive:  Appropriate  Insight: Appropriate  Engagement in Group:  Engaged  Modes of Intervention:  Discussion  Additional Comments:  Discussed the environment dimensions of wellness and how it can lead to a sense of wellbeing.  Jerrye Beavers 12/08/2021, 6:53 PM

## 2021-12-08 NOTE — Progress Notes (Signed)
   12/08/21 2045  Psych Admission Type (Psych Patients Only)  Admission Status Voluntary  Psychosocial Assessment  Patient Complaints Anxiety;Depression  Eye Contact Fair  Facial Expression Anxious  Affect Anxious;Sad  Speech Logical/coherent  Interaction Assertive  Motor Activity Slow  Appearance/Hygiene In scrubs  Behavior Characteristics Cooperative  Mood Anxious;Depressed  Aggressive Behavior  Effect No apparent injury  Thought Process  Coherency WDL  Content Blaming self  Delusions None reported or observed  Perception WDL  Hallucination None reported or observed  Judgment Impaired  Confusion None  Danger to Self  Current suicidal ideation? Denies  Danger to Others  Danger to Others None reported or observed

## 2021-12-08 NOTE — BH IP Treatment Plan (Signed)
Interdisciplinary Treatment and Diagnostic Plan Update  12/08/2021 Time of Session: [place] MRN: [id number]  Principal Diagnosis: Bipolar 2 disorder, major depressive episode ([place])  Secondary Diagnoses: Principal Problem:   Bipolar 2 disorder, major depressive episode ([place]) Active Problems:   Chronic post-traumatic stress disorder (PTSD)   Delta-9-tetrahydrocannabinol (THC) dependence ([facility])   GAD (generalized anxiety disorder)   Current Medications:  Current Facility-Administered Medications  Medication Dose Route Frequency Provider Last Rate Last Admin   acetaminophen (TYLENOL) tablet 650 mg  650 mg Oral Q6H PRN Rozetta Nunnery, NP       alum & mag hydroxide-simeth (MAALOX/MYLANTA) 200-200-20 MG/5ML suspension 30 mL  30 mL Oral Q4H PRN Rozetta Nunnery, NP       hydrOXYzine (ATARAX) tablet 25 mg  25 mg Oral TID PRN Rozetta Nunnery, NP       magnesium hydroxide (MILK OF MAGNESIA) suspension 30 mL  30 mL Oral Daily PRN Rozetta Nunnery, NP       neomycin-bacitracin-polymyxin (NEOSPORIN) ointment   Topical BID Massengill, Ovid Curd, MD   Given at 12/07/21 2143   traZODone (DESYREL) tablet 50 mg  50 mg Oral QHS PRN Massengill, Ovid Curd, MD       ziprasidone (GEODON) capsule 20 mg  20 mg Oral BID WC Massengill, Ovid Curd, MD   20 mg at 12/08/21 2993   PTA Medications: Medications Prior to Admission  Medication Sig Dispense Refill Last Dose   ARIPiprazole (ABILIFY) 10 MG tablet Take 1 tablet (10 mg total) by mouth daily. (Patient not taking: Reported on 12/07/2021) 30 tablet 0    gabapentin (NEURONTIN) 100 MG capsule Take 2 capsules (200 mg total) by mouth 3 (three) times daily. (Patient not taking: Reported on 12/07/2021) 180 capsule 0     Patient Stressors:    Patient Strengths:    Treatment Modalities: Medication Management, Group therapy, Case management,  1 to 1 session with clinician, Psychoeducation, Recreational therapy.   Physician Treatment Plan for Primary Diagnosis:  Bipolar 2 disorder, major depressive episode ([place]) Long Term Goal(s): Improvement in symptoms so as ready for discharge   Short Term Goals: Ability to identify changes in lifestyle to reduce recurrence of condition will improve Ability to verbalize feelings will improve Ability to disclose and discuss suicidal ideas Ability to demonstrate self-control will improve Ability to identify and develop effective coping behaviors will improve Ability to maintain clinical measurements within normal limits will improve Compliance with prescribed medications will improve Ability to identify triggers associated with substance abuse/mental health issues will improve  Medication Management: Evaluate patient's response, side effects, and tolerance of medication regimen.  Therapeutic Interventions: 1 to 1 sessions, Unit Group sessions and Medication administration.  Evaluation of Outcomes: Not Met  Physician Treatment Plan for Secondary Diagnosis: Principal Problem:   Bipolar 2 disorder, major depressive episode ([place]) Active Problems:   Chronic post-traumatic stress disorder (PTSD)   Delta-9-tetrahydrocannabinol (THC) dependence (HCC)   GAD (generalized anxiety disorder)  Long Term Goal(s): Improvement in symptoms so as ready for discharge   Short Term Goals: Ability to identify changes in lifestyle to reduce recurrence of condition will improve Ability to verbalize feelings will improve Ability to disclose and discuss suicidal ideas Ability to demonstrate self-control will improve Ability to identify and develop effective coping behaviors will improve Ability to maintain clinical measurements within normal limits will improve Compliance with prescribed medications will improve Ability to identify triggers associated with substance abuse/mental health issues will improve     Medication Management: Evaluate  patient's response, side effects, and tolerance of medication regimen.  Therapeutic  Interventions: 1 to 1 sessions, Unit Group sessions and Medication administration.  Evaluation of Outcomes: Not Met   RN Treatment Plan for Primary Diagnosis: Bipolar 2 disorder, major depressive episode ([place]) Long Term Goal(s): Knowledge of disease and therapeutic regimen to maintain health will improve  Short Term Goals: Ability to remain free from injury will improve, Ability to verbalize frustration and anger appropriately will improve, Ability to demonstrate self-control, Ability to participate in decision making will improve, Ability to verbalize feelings will improve, Ability to disclose and discuss suicidal ideas, Ability to identify and develop effective coping behaviors will improve, and Compliance with prescribed medications will improve  Medication Management: RN will administer medications as ordered by provider, will assess and evaluate patient's response and provide education to patient for prescribed medication. RN will report any adverse and/or side effects to prescribing provider.  Therapeutic Interventions: 1 on 1 counseling sessions, Psychoeducation, Medication administration, Evaluate responses to treatment, Monitor vital signs and CBGs as ordered, Perform/monitor CIWA, COWS, AIMS and Fall Risk screenings as ordered, Perform wound care treatments as ordered.  Evaluation of Outcomes: Not Met   LCSW Treatment Plan for Primary Diagnosis: Bipolar 2 disorder, major depressive episode ([place]) Long Term Goal(s): Safe transition to appropriate next level of care at discharge, Engage patient in therapeutic group addressing interpersonal concerns.  Short Term Goals: Engage patient in aftercare planning with referrals and resources, Increase social support, Increase ability to appropriately verbalize feelings, Increase emotional regulation, Facilitate acceptance of mental health diagnosis and concerns, Facilitate patient progression through stages of change regarding substance use  diagnoses and concerns, Identify triggers associated with mental health/substance abuse issues, and Increase skills for wellness and recovery  Therapeutic Interventions: Assess for all discharge needs, 1 to 1 time with Social worker, Explore available resources and support systems, Assess for adequacy in community support network, Educate family and significant other(s) on suicide prevention, Complete Psychosocial Assessment, Interpersonal group therapy.  Evaluation of Outcomes: Not Met   Progress in Treatment: Attending groups: Yes. Participating in groups: Yes. Taking medication as prescribed: Yes. Toleration medication: Yes. Family/Significant other contact made: No, will contact:  [place] [phone] (Girlfriend Patient understands diagnosis: Yes. Discussing patient identified problems/goals with staff: Yes. Medical problems stabilized or resolved: Yes. Denies suicidal/homicidal ideation: Yes. Issues/concerns per patient self-inventory: Yes.   New problem(s) identified: No, Describe:  none reported  New Short Term/Long Term Goal(s): Mood Regulation  Patient Goals: Medication Stabilization  Discharge Plan or Barriers: none reported  Reason for Continuation of Hospitalization: Anxiety Depression Mania Medication stabilization  Estimated Length of Stay:  Last [place] Suicide Severity Risk Score: Flowsheet Row Admission (Current) from 12/07/2021 in [facility] ED from 12/06/2021 in [facility] Admission (Discharged) from 08/21/2021 in [facility]  C-SSRS RISK CATEGORY No Risk High Risk Error: Q3, 4, or 5 should not be populated when Q2 is No       Last PHQ 2/9 Scores:     No data to display          Scribe for Treatment Team: Windle Guard, LCSW 12/08/2021 3:51 PM

## 2021-12-08 NOTE — Progress Notes (Signed)
   12/08/21 0515  Sleep  Number of Hours 7.25

## 2021-12-09 NOTE — BHH Group Notes (Signed)
[place] Group Notes:  (Nursing/MHT/Case Management/Adjunct)  Date:  12/09/2021  Time:  10:26 AM  Type of Therapy:   Goals/ Orientation   Participation Level:  Active  Participation Quality:  Appropriate  Affect:  Appropriate  Cognitive:  Appropriate  Insight:  Appropriate  Engagement in Group:   Engaged  Modes of Intervention:  Education  Summary of Progress/Problems:pt. Participated and engaged  Bertram Savin 12/09/2021, 10:26 AM

## 2021-12-09 NOTE — Progress Notes (Signed)
   12/09/21 2228  Psych Admission Type (Psych Patients Only)  Admission Status Voluntary  Psychosocial Assessment  Patient Complaints Anxiety  Eye Contact Fair  Facial Expression Animated  Affect Appropriate to circumstance  Speech Logical/coherent  Interaction Assertive  Motor Activity Other (Comment) (WDL)  Appearance/Hygiene Unremarkable  Behavior Characteristics Appropriate to situation  Mood Anxious;Pleasant  Thought Process  Coherency WDL  Content WDL  Delusions None reported or observed  Perception WDL  Hallucination None reported or observed  Judgment Impaired  Confusion None  Danger to Self  Current suicidal ideation? Denies  Agreement Not to Harm Self Yes  Description of Agreement verbal  Danger to Others  Danger to Others None reported or observed

## 2021-12-09 NOTE — Group Note (Signed)
Date:  12/09/2021 Time:  4:08 PM  Group Topic/Focus:  Anger Management  Additional Comments:  Pt was provided with anger management materials and encouraged to come to staff with any questions.   Jose Alleyne J Tiane Szydlowski 12/09/2021, 4:08 PM  

## 2021-12-09 NOTE — Progress Notes (Signed)
   12/09/21 0900  Psych Admission Type (Psych Patients Only)  Admission Status Voluntary  Psychosocial Assessment  Patient Complaints Anxiety;Depression  Eye Contact Fair  Facial Expression Anxious;Worried  Affect Appropriate to circumstance  Catering manager Activity Slow  Appearance/Hygiene In scrubs  Behavior Characteristics Cooperative  Mood Anxious;Depressed  Aggressive Behavior  Effect No apparent injury  Thought Process  Coherency WDL  Content Blaming self  Delusions None reported or observed  Perception WDL  Hallucination None reported or observed  Judgment Impaired  Confusion None  Danger to Self  Current suicidal ideation? Denies  Agreement Not to Harm Self Yes  Description of Agreement Verbal  Danger to Others  Danger to Others None reported or observed

## 2021-12-09 NOTE — Progress Notes (Signed)
[facility] MD Progress Note  12/09/2021 1:00 PM Devon Lopez  MRN:  [id number]  Principal Problem: Bipolar 2 disorder, major depressive episode ([place]) Diagnosis: Principal Problem:   Bipolar 2 disorder, major depressive episode ([place]) Active Problems:   Chronic post-traumatic stress disorder (PTSD)   Delta-9-tetrahydrocannabinol (THC) dependence ([place])   GAD (generalized anxiety disorder)  Patient is a 21 year old male with a past psychiatric history of bipolar disorder, GAD, PTSD, and (reported) ADHD, who was admitted to the psychiatric unit from [facility] emergency department after suicide attempt via cutting his wrists and arms while drinking alcohol.  Interval History Patient was seen today for re-evaluation.  Nursing reports no events overnight. The patient has no issues with performing ADLs.  Patient has been medication compliant.    Patient was seen and interviewed by attending psychiatrist. Chart reviewed. Patient discussed during treatment team rounds.  Subjective:  On assessment patient reports feeling reports being in good mood overall, reports feeling less depressed, less anxious, denies panic attacks. He reports good sleep and appetite. He denies any symptoms of psychosis - denies auditory or visual hallucinations, denies feeling paranoid, unsafe, does not express any delusions. He denies thoughts or plans of hurting self or others. He reports having more energy today after he took his first dose of Modafinil; denies any adverse reactions after taking the first dose of the medications - denies tachycardia, chest discomfort, shortness of breath, headache, does not report any physical complaints and reports no side effects from any of the medications he is getting here. He is willing to continue all the medications. Reports changing own dressings and denies any signs of infection of lacerations.   Labs: no new results for review.  Total Time spent with patient: 20 min.  Past  Psychiatric History: see H&P  Past Medical History:  Past Medical History:  Diagnosis Date   Anxiety    Bipolar 2 disorder ([place])    Depression    PTSD (post-traumatic stress disorder)    Suicide attempt ([place])    TBI (traumatic brain injury) ([place]) 2016   History reviewed. No pertinent surgical history. Family History: History reviewed. No pertinent family history. Family Psychiatric  History:  see H&P Social History:  Social History   Substance and Sexual Activity  Alcohol Use Never     Social History   Substance and Sexual Activity  Drug Use Yes   Types: Marijuana    Social History   Socioeconomic History   Marital status: Single    Spouse name: Not on file   Number of children: Not on file   Years of education: Not on file   Highest education level: Not on file  Occupational History   Not on file  Tobacco Use   Smoking status: Never   Smokeless tobacco: Never  Vaping Use   Vaping Use: Never used  Substance and Sexual Activity   Alcohol use: Never   Drug use: Yes    Types: Marijuana   Sexual activity: Not Currently  Other Topics Concern   Not on file  Social History Narrative   Not on file   Social Determinants of Health   Financial Resource Strain: Not on file  Food Insecurity: No Food Insecurity (12/07/2021)   Hunger Vital Sign    Worried About Running Out of Food in the Last Year: Never true    Ran Out of Food in the Last Year: Never true  Transportation Needs: No Transportation Needs (12/07/2021)   PRAPARE - Transportation  Lack of Transportation (Medical): No    Lack of Transportation (Non-Medical): No  Physical Activity: Not on file  Stress: Not on file  Social Connections: Not on file   Additional Social History:                         Sleep: Good  Appetite:  Good  Current Medications: Current Facility-Administered Medications  Medication Dose Route Frequency Provider Last Rate Last Admin   acetaminophen (TYLENOL) tablet  650 mg  650 mg Oral Q6H PRN Rozetta Nunnery, NP       alum & mag hydroxide-simeth (MAALOX/MYLANTA) 200-200-20 MG/5ML suspension 30 mL  30 mL Oral Q4H PRN Rozetta Nunnery, NP       hydrOXYzine (ATARAX) tablet 25 mg  25 mg Oral TID PRN Rozetta Nunnery, NP       magnesium hydroxide (MILK OF MAGNESIA) suspension 30 mL  30 mL Oral Daily PRN Rozetta Nunnery, NP       modafinil (PROVIGIL) tablet 100 mg  100 mg Oral Daily Massengill, Ovid Curd, MD   100 mg at 12/09/21 [id number]   neomycin-bacitracin-polymyxin (NEOSPORIN) ointment   Topical BID Massengill, Ovid Curd, MD   Given at 12/08/21 2158   traZODone (DESYREL) tablet 50 mg  50 mg Oral QHS PRN Massengill, Ovid Curd, MD       ziprasidone (GEODON) capsule 40 mg  40 mg Oral BID WC Massengill, Ovid Curd, MD   40 mg at 12/09/21 [id number]    Lab Results:  Results for orders placed or performed during the hospital encounter of 12/07/21 (from the past 48 hour(s))  Rapid urine drug screen (hospital performed)     Status: Abnormal   Collection Time: 12/07/21  7:48 PM  Result Value Ref Range   Opiates NONE DETECTED NONE DETECTED   Cocaine NONE DETECTED NONE DETECTED   Benzodiazepines NONE DETECTED NONE DETECTED   Amphetamines NONE DETECTED NONE DETECTED   Tetrahydrocannabinol POSITIVE (A) NONE DETECTED   Barbiturates NONE DETECTED NONE DETECTED    Comment: (NOTE) DRUG SCREEN FOR MEDICAL PURPOSES ONLY.  IF CONFIRMATION IS NEEDED FOR ANY PURPOSE, NOTIFY LAB WITHIN 5 DAYS.  LOWEST DETECTABLE LIMITS FOR URINE DRUG SCREEN Drug Class                     Cutoff (ng/mL) Amphetamine and metabolites    1000 Barbiturate and metabolites    200 Benzodiazepine                 [id number] Tricyclics and metabolites     300 Opiates and metabolites        300 Cocaine and metabolites        300 THC                            50 Performed at [facility], [place] [address], [place], [place] [address]   Hemoglobin A1c     Status: None   Collection Time: 12/08/21  7:00 AM   Result Value Ref Range   Hgb A1c MFr Bld 5.2 4.8 - 5.6 %    Comment: (NOTE) Pre diabetes:          5.7%-6.4%  Diabetes:              >6.4%  Glycemic control for   <7.0% adults with diabetes    Mean Plasma Glucose 102.54 mg/dL    Comment: Performed at  [facility], [place] [address], [place], [place] [address]  Lipid panel     Status: Abnormal   Collection Time: 12/08/21  7:00 AM  Result Value Ref Range   Cholesterol 204 (H) 0 - 200 mg/dL   Triglycerides 335 (H) <150 mg/dL   HDL 32 (L) >40 mg/dL   Total CHOL/HDL Ratio 6.4 RATIO   VLDL 67 (H) 0 - 40 mg/dL   LDL Cholesterol 105 (H) 0 - 99 mg/dL    Comment:        Total Cholesterol/HDL:CHD Risk Coronary Heart Disease Risk Table                     Men   Women  1/2 Average Risk   3.4   3.3  Average Risk       5.0   4.4  2 X Average Risk   9.6   7.1  3 X Average Risk  23.4   11.0        Use the calculated Patient Ratio above and the CHD Risk Table to determine the patient's CHD Risk.        ATP III CLASSIFICATION (LDL):  <100     mg/dL   Optimal  100-129  mg/dL   Near or Above                    Optimal  130-159  mg/dL   Borderline  160-189  mg/dL   High  >190     mg/dL   Very High Performed at [place] [address], [place], [place] [address]   TSH     Status: None   Collection Time: 12/08/21  7:00 AM  Result Value Ref Range   TSH 2.827 0.350 - 4.500 uIU/mL    Comment: Performed by a 3rd Generation assay with a functional sensitivity of <=0.01 uIU/mL. Performed at [facility], [place] [address] [place], [place] [address]     Blood Alcohol level:  Lab Results  Component Value Date   ETH <10 12/06/2021   ETH <10 [id number]    Metabolic Disorder Labs: Lab Results  Component Value Date   HGBA1C 5.2 12/08/2021   MPG 102.54 12/08/2021   MPG 102.54 08/21/2021   No results found for: "PROLACTIN" Lab Results  Component Value Date   CHOL 204 (H) 12/08/2021   TRIG  335 (H) 12/08/2021   HDL 32 (L) 12/08/2021   CHOLHDL 6.4 12/08/2021   VLDL 67 (H) 12/08/2021   LDLCALC 105 (H) 12/08/2021   LDLCALC 86 08/23/2021    Physical Findings: AIMS:  , ,  ,  ,    CIWA:  CIWA-Ar Total: 2 COWS:     Musculoskeletal: Strength & Muscle Tone: within normal limits Gait & Station: normal Patient leans: N/A  Psychiatric Specialty Exam:  Appearance:  CM, appearing stated age, appears well-nourished;  wearing appropriate to the situation casual clothes, with fair grooming and hygiene. Normal level of alertness and appropriate facial expression.  Attitude/Behavior: calm, cooperative, engaging with appropriate eye contact.  Motor: WNL; dyskinesias not evident. Gait appears in full range.  Speech: spontaneous, clear, coherent, normal comprehension.  Mood: euthymic, " pretty good ".  Affect: appropriately-reactive, full range.  Thought process: patient appears coherent, organized, logical, goal-directed, associations are appropriate.  Thought content: patient denies suicidal thoughts, denies homicidal thoughts; did not express any delusions.  Thought perception: patient denies auditory and visual hallucinations, no illusions, no depersonalizations.  Did not appear internally stimulated.  Cognition: patient is alert and oriented in self, place, date.  Insight: fair, in regards of understanding of presence, nature, cause, and significance of mental or emotional problem.  Judgement: fair, in regards of ability to make good decisions concerning the appropriate thing to do in various situations, including ability to form opinions regarding their mental health condition.   Assets  Assets: Armed forces logistics/support/administrative officer; Desire for Improvement; Financial Resources/Insurance; Housing; Intimacy; Resilience; Social Support; Transportation   Sleep  Sleep:No data recorded   Physical Exam: Physical Exam ROS Blood pressure 139/87, pulse 90, temperature 97.9 F (36.6 C),  temperature source Oral, resp. rate 18, height 6' (1.829 m), weight 92.9 kg, SpO2 97 %. Body mass index is 27.78 kg/m. Physical/General: alert, NAD.  Skin: no rashes.  HEENT:  Normocephalic, atraumatic, PERRLA.   Trunk/Extremities: no gross abnormalities evident  Pulmonary: Pulmonary effort is normal.   Neuro: grossly non-focal, dyskinesias not evident, gait appears in full range.    Mental Status: He is alert.   Other Medical: N/A   Vitals and nursing note reviewed.    Review of Systems: Respiratory:  Negative for shortness of breath.   Cardiovascular:  Negative for chest pain.  Gastrointestinal:  Negative for diarrhea, nausea and vomiting.  Neurological:  Negative for dizziness and headaches.    Treatment Plan Summary: Daily contact with patient to assess and evaluate symptoms and progress in treatment and Medication management  Patient is a 21 year old male with the above-stated past psychiatric history who is seen in follow-up.  Chart reviewed. Patient discussed with nursing. Patient reports improvement of mood and energy level. No safety concerns. Will continue medications without changes as per the plan below.   Diagnoses / Active Problems: -Bipolar disorder, type II, current episode is depressed, without psychotic features -GAD -PTSD -Reported history of ADHD -Delta 8 use -Reported history of TBI   PLAN: Safety and Monitoring: continue inpatient psych admission; 15-minute checks; daily contact with patient to assess and evaluate symptoms and progress in treatment; psychoeducation.Vital signs: q12 hours. Precautions: suicide, elopement, and assault. Placed on room lock out for meals, snacks and groups.  Psychiatric Problems: -continue Geodon 40 mg twice daily with food-for bipolar disorder. Patient had previously tried Seroquel, which caused too much sedation, and Abilify which caused akathisia. -Patient declined to restart gabapentin at this time due to not having  PCP to continue this prescription after discharge.  Patient offered PCP referral. -continue modafinil 100 mg once daily for chronic fatigue.  Patent tolerated first dose of medication well, no adverse reactions noted.   - Metabolic profile and EKG monitoring obtained while on an atypical antipsychotic (BMI: Lipid Panel: HbgA1c: QTc:)   - Encouraged patient to participate in unit milieu and in scheduled group therapies   Medical Problems: patient changes own dressings and denies any signs of infection of lacerations.          PRN medications: acetaminophen, alum & mag hydroxide-simeth, hydrOXYzine, magnesium hydroxide, traZODone             Discharge Planning:              -- Social work and case management to assist with discharge planning and identification of hospital follow-up needs prior to discharge             -- Estimated LOS: 5-7 days             -- Discharge Concerns: Need to establish a safety plan; Medication compliance and effectiveness             --  Discharge Goals: Return home with outpatient referrals for mental health follow-up including medication management/psychotherapy  Total Time Spent in Direct Patient Care:  I personally spent 35 minutes on the unit in direct patient care. The direct patient care time included face-to-face time with the patient, reviewing the patient's chart, communicating with other professionals, and coordinating care. Greater than 50% of this time was spent in counseling or coordinating care with the patient regarding goals of hospitalization, psycho-education, and discharge planning needs.     Larita Fife, MD 12/09/2021, 1:00 PM

## 2021-12-09 NOTE — [facility] Group Notes (Signed)
[facility] Group Notes:  (Nursing/MHT/Case Management/Adjunct)  Date:  12/09/2021  Time:  10:32 PM  Type of Therapy:  Psychoeducational Skills  Participation Level:  Active  Participation Quality:  Appropriate, Sharing, and Supportive  Affect:  Appropriate  Cognitive:  Alert, Appropriate, and Oriented  Insight:  Appropriate and Good  Engagement in Group:  Engaged  Modes of Intervention:  Discussion  Summary of Progress/Problems: Pt shared on this evening how today was great overall.  His hoping for the same results on tomorrow.    Blenda Mounts [place] 12/09/2021, 10:32 PM

## 2021-12-09 NOTE — Progress Notes (Signed)
   12/09/21 0515  Sleep  Number of Hours 7

## 2021-12-09 NOTE — Group Note (Signed)
LCSW Group Therapy Note  12/09/2021    10:00-11:00am   Type of Therapy and Topic:  Group Therapy: Early Messages Received About Anger  Participation Level:  Active   Description of Group:   In this group, patients shared and discussed the early messages received in their lives about anger through parental or other adult modeling, teaching, repression, punishment, violence, and more.  Participants identified how those childhood lessons influence even now how they usually or often react when angered.  The group discussed that anger is a secondary emotion and what may be the underlying emotional themes that come out through anger outbursts or that are ignored through anger suppression.    Therapeutic Goals: Patients will identify one or more childhood message about anger that they received and how it was taught to them. Patients will discuss how these childhood experiences have influenced and continue to influence their own expression or repression of anger even today. Patients will explore possible primary emotions that tend to fuel their secondary emotion of anger. Patients will learn that anger itself is normal and cannot be eliminated, and that healthier coping skills can assist with resolving conflict rather than worsening situations.  Summary of Patient Progress:  The patient shared that their childhood lessons about anger were "I was bullied as a kid and got into many fights".  As a result, "I learned to repress my emotions until they explode out of me".  The patient participated fully and demonstrated insight.  Therapeutic Modalities:   Cognitive Behavioral Therapy Motivation Interviewing  [place], [place] 12/09/2021 11:37 AM

## 2021-12-10 DIAGNOSIS — F3181 Bipolar II disorder: Principal | ICD-10-CM

## 2021-12-10 NOTE — Progress Notes (Signed)
   12/10/21 2333  Psych Admission Type (Psych Patients Only)  Admission Status Voluntary  Psychosocial Assessment  Patient Complaints Anxiety  Eye Contact Fair  Facial Expression Anxious  Affect Appropriate to circumstance  Speech Logical/coherent  Interaction Assertive  Motor Activity Other (Comment) (WDL)  Appearance/Hygiene Unremarkable  Behavior Characteristics Appropriate to situation  Mood Pleasant  Thought Process  Coherency WDL  Content WDL  Delusions None reported or observed  Perception WDL  Hallucination None reported or observed  Judgment Impaired  Confusion None  Danger to Self  Current suicidal ideation? Denies  Agreement Not to Harm Self Yes  Description of Agreement verbal  Danger to Others  Danger to Others None reported or observed

## 2021-12-10 NOTE — Progress Notes (Signed)
[facility] Group Notes:  (Nursing/MHT/Case Management/Adjunct) Psychoeducational Group Note  Date:  12/10/2021 Time:  2000  Group Topic/Focus:  Wrap up group  Participation Level: Did Not Attend  Participation Quality:  Not Applicable  Affect:  Not Applicable  Cognitive:  Not Applicable  Insight:  Not Applicable  Engagement in Group: Not Applicable  Additional Comments:  Did not attend.   Winfield Rast S 12/10/2021, 9:25 PM

## 2021-12-10 NOTE — [facility] Group Notes (Signed)
Patient attended group therapy activity called "Now Future Wall". Activity included things that are currently going on in the patient's life, what they want their futures to look like, and what walls or barriers are getting in the way. On the back side, patient listed coping skills to help them when they are faced with the "walls". Patient identified "friendship" as one of their future goals.

## 2021-12-10 NOTE — Progress Notes (Signed)
   12/10/21 0900  Charting Type  Charting Type Shift assessment  Neurological  Neuro (WDL) WDL  HEENT  HEENT (WDL) WDL  Respiratory  Respiratory (WDL) WDL  Cardiac  Cardiac (WDL) WDL  Vascular  Vascular (WDL) WDL  Integumentary  Integumentary (WDL) X  Braden Scale (Ages 8 and up)  Sensory Perceptions 4  Moisture 4  Activity 4  Mobility 4  Nutrition 3  Friction and Shear 3  Braden Scale Score 22  Musculoskeletal  Musculoskeletal (WDL) X  Gastrointestinal  Gastrointestinal (WDL) WDL  GU Assessment  Genitourinary (WDL) WDL  Neurological  Level of Consciousness Alert

## 2021-12-10 NOTE — Plan of Care (Signed)
  Problem: Coping: Goal: Coping ability will improve Outcome: Progressing Goal: Will verbalize feelings Outcome: Progressing   

## 2021-12-10 NOTE — Group Note (Signed)
[facility] LCSW Group Therapy Note   Group Date: 12/10/2021 Start Time: 1300 End Time: 1400   Type of Therapy and Topic: Group Therapy: Avoiding Self-Sabotaging and Enabling Behaviors  Participation Level: Minimal  Description of Group:  In this group, patients will learn how to identify obstacles, self-sabotaging and enabling behaviors, as well as: what are they, why do we do them and what needs these behaviors meet. Discuss unhealthy relationships and how to have positive healthy boundaries with those that sabotage and enable. Explore aspects of self-sabotage and enabling in yourself and how to limit these self-destructive behaviors in everyday life.   Therapeutic Goals: 1. Patient will identify one obstacle that relates to self-sabotage and enabling behaviors 2. Patient will identify one personal self-sabotaging or enabling behavior they did prior to admission 3. Patient will state a plan to change the above identified behavior 4. Patient will demonstrate ability to communicate their needs through discussion and/or role play.    Summary of Patient Progress: Patient was present for the entirety of group session. Patient participated in opening and closing remarks. However, patient did not contribute at all to the topic of discussion despite encouraged participation.    Therapeutic Modalities:  Cognitive Behavioral Therapy Person-Centered Therapy Motivational Interviewing    Schelly Chuba W Jabori Henegar, LCSWA 

## 2021-12-10 NOTE — Progress Notes (Signed)
[facility] MD Progress Note  12/10/2021 11:32 AM Devon Lopez  MRN:  [id number]  Principal Problem: Bipolar 2 disorder, major depressive episode (HCC) Diagnosis: Principal Problem:   Bipolar 2 disorder, major depressive episode (HCC) Active Problems:   Chronic post-traumatic stress disorder (PTSD)   Delta-9-tetrahydrocannabinol (THC) dependence (HCC)   GAD (generalized anxiety disorder)  Patient is a 21 year old male with a past psychiatric history of bipolar disorder, GAD, PTSD, and (reported) ADHD, who was admitted to the psychiatric unit from [facility] emergency department after suicide attempt via cutting his wrists and arms while drinking alcohol.  Interval History Patient was seen today for re-evaluation.  Nursing reports no events overnight. The patient has no issues with performing ADLs.  Patient has been medication compliant.    Patient was seen and interviewed by attending psychiatrist. Chart reviewed. Patient discussed during treatment team rounds.  Subjective:  On assessment patient reports feeling much less tired and being in good mood overall. He denies feeling depressed, reports feeling less anxious, denies panic attacks. He reports good sleep and appetite. He denies auditory or visual hallucinations, denies feeling paranoid, unsafe, does not express any delusions. He denies thoughts or plans of hurting self or others. He continues to report having more energy after initiated on Modafinil; denies any adverse reactions from the current dose of the medication.  Denies side effects from any of the medications he is getting here and is willing to continue all the medications. Reports changing own dressings and denies any signs of infection of lacerations. He hopes he can be discharged tomorrow.  Labs: no new results for review.  Total Time spent with patient: 20 min.  Past Psychiatric History: see H&P  Past Medical History:  Past Medical History:  Diagnosis Date   Anxiety     Bipolar 2 disorder (HCC)    Depression    PTSD (post-traumatic stress disorder)    Suicide attempt (HCC)    TBI (traumatic brain injury) (HCC) 2016   History reviewed. No pertinent surgical history. Family History: History reviewed. No pertinent family history. Family Psychiatric  History:  see H&P Social History:  Social History   Substance and Sexual Activity  Alcohol Use Never     Social History   Substance and Sexual Activity  Drug Use Yes   Types: Marijuana    Social History   Socioeconomic History   Marital status: Single    Spouse name: Not on file   Number of children: Not on file   Years of education: Not on file   Highest education level: Not on file  Occupational History   Not on file  Tobacco Use   Smoking status: Never   Smokeless tobacco: Never  Vaping Use   Vaping Use: Never used  Substance and Sexual Activity   Alcohol use: Never   Drug use: Yes    Types: Marijuana   Sexual activity: Not Currently  Other Topics Concern   Not on file  Social History Narrative   Not on file   Social Determinants of Health   Financial Resource Strain: Not on file  Food Insecurity: No Food Insecurity (12/07/2021)   Hunger Vital Sign    Worried About Running Out of Food in the Last Year: Never true    Ran Out of Food in the Last Year: Never true  Transportation Needs: No Transportation Needs (12/07/2021)   PRAPARE - Administrator, Civil Service (Medical): No    Lack of Transportation (Non-Medical): No  Physical  Activity: Not on file  Stress: Not on file  Social Connections: Not on file   Additional Social History:                         Sleep: Good  Appetite:  Good  Current Medications: Current Facility-Administered Medications  Medication Dose Route Frequency Provider Last Rate Last Admin   acetaminophen (TYLENOL) tablet 650 mg  650 mg Oral Q6H PRN Jackelyn Poling, NP       alum & mag hydroxide-simeth (MAALOX/MYLANTA) 200-200-20  MG/5ML suspension 30 mL  30 mL Oral Q4H PRN Nira Conn A, NP       hydrOXYzine (ATARAX) tablet 25 mg  25 mg Oral TID PRN Nira Conn A, NP   25 mg at 12/09/21 2105   magnesium hydroxide (MILK OF MAGNESIA) suspension 30 mL  30 mL Oral Daily PRN Jackelyn Poling, NP       modafinil (PROVIGIL) tablet 100 mg  100 mg Oral Daily Massengill, Harrold Donath, MD   100 mg at 12/10/21 2440   neomycin-bacitracin-polymyxin (NEOSPORIN) ointment   Topical BID Phineas Inches, MD   Given at 12/08/21 2158   traZODone (DESYREL) tablet 50 mg  50 mg Oral QHS PRN Massengill, Harrold Donath, MD   50 mg at 12/09/21 2105   ziprasidone (GEODON) capsule 40 mg  40 mg Oral BID WC Massengill, Harrold Donath, MD   40 mg at 12/10/21 1027    Lab Results:  No results found for this or any previous visit (from the past 48 hour(s)).   Blood Alcohol level:  Lab Results  Component Value Date   ETH <10 12/06/2021   ETH <10 08/19/2021    Metabolic Disorder Labs: Lab Results  Component Value Date   HGBA1C 5.2 12/08/2021   MPG 102.54 12/08/2021   MPG 102.54 08/21/2021   No results found for: "PROLACTIN" Lab Results  Component Value Date   CHOL 204 (H) 12/08/2021   TRIG 335 (H) 12/08/2021   HDL 32 (L) 12/08/2021   CHOLHDL 6.4 12/08/2021   VLDL 67 (H) 12/08/2021   LDLCALC 105 (H) 12/08/2021   LDLCALC 86 08/23/2021    Physical Findings: AIMS:  , ,  ,  ,    CIWA:  CIWA-Ar Total: 2 COWS:     Musculoskeletal: Strength & Muscle Tone: within normal limits Gait & Station: normal Patient leans: N/A  Psychiatric Specialty Exam:  Appearance:  CM, appearing stated age, appears well-nourished;  wearing appropriate to the situation casual clothes, with fair grooming and hygiene. Normal level of alertness and appropriate facial expression.  Attitude/Behavior: calm, cooperative, engaging with appropriate eye contact.  Motor: WNL; dyskinesias not evident. Gait appears in full range.  Speech: spontaneous, clear, coherent, normal  comprehension.  Mood: euthymic, "good ".  Affect: appropriately-reactive, full range.  Thought process: patient appears coherent, organized, logical, goal-directed, associations are appropriate.  Thought content: patient denies suicidal thoughts, denies homicidal thoughts; did not express any delusions.  Thought perception: patient denies auditory and visual hallucinations, no illusions, no depersonalizations. Did not appear internally stimulated.  Cognition: patient is alert and oriented in self, place, date.  Insight: fair, in regards of understanding of presence, nature, cause, and significance of mental or emotional problem.  Judgement: fair, in regards of ability to make good decisions concerning the appropriate thing to do in various situations, including ability to form opinions regarding their mental health condition.   Assets  Assets: Manufacturing systems engineer; Desire for Improvement; Financial Resources/Insurance; Housing;  Intimacy; Resilience; Social Support; Transportation   Sleep  Sleep:No data recorded   Physical Exam: Physical Exam ROS Blood pressure 113/81, pulse 92, temperature 97.7 F (36.5 C), temperature source Oral, resp. rate 18, height 6' (1.829 m), weight 92.9 kg, SpO2 97 %. Body mass index is 27.78 kg/m. Physical/General: alert, NAD.  Skin: no rashes.  HEENT:  Normocephalic, atraumatic, PERRLA.   Trunk/Extremities: no gross abnormalities evident  Pulmonary: Pulmonary effort is normal.   Neuro: grossly non-focal, dyskinesias not evident, gait appears in full range.    Mental Status: He is alert.   Other Medical: N/A   Vitals and nursing note reviewed.    Review of Systems: Respiratory:  Negative for shortness of breath.   Cardiovascular:  Negative for chest pain.  Gastrointestinal:  Negative for diarrhea, nausea and vomiting.  Neurological:  Negative for dizziness and headaches.    Treatment Plan Summary: Daily contact with patient to  assess and evaluate symptoms and progress in treatment and Medication management  Patient is a 21 year old male with the above-stated past psychiatric history who is seen in follow-up.  Chart reviewed. Patient discussed with nursing. Patient reports improvement of mood and energy level. No safety concerns. Will continue medications without changes as per the plan below.   Diagnoses / Active Problems: -Bipolar disorder, type II, current episode is depressed, without psychotic features -GAD -PTSD -Reported history of ADHD -Delta 8 use -Reported history of TBI   PLAN: Safety and Monitoring: continue inpatient psych admission; 15-minute checks; daily contact with patient to assess and evaluate symptoms and progress in treatment; psychoeducation.Vital signs: q12 hours. Precautions: suicide, elopement, and assault. Placed on room lock out for meals, snacks and groups.  Psychiatric Problems: -continue Geodon 40 mg twice daily with food-for bipolar disorder. Patient had previously tried Seroquel, which caused too much sedation, and Abilify which caused akathisia. -Patient declined to restart gabapentin at this time due to not having PCP to continue this prescription after discharge.  Patient offered PCP referral. -continue modafinil 100 mg once daily for chronic fatigue.  Patent tolerated first dose of medication well, no adverse reactions noted.   - Metabolic profile and EKG monitoring obtained while on an atypical antipsychotic (BMI: Lipid Panel: HbgA1c: QTc:)   - Encouraged patient to participate in unit milieu and in scheduled group therapies   Medical Problems: patient changes own dressings and denies any signs of infection of lacerations.          PRN medications: acetaminophen, alum & mag hydroxide-simeth, hydrOXYzine, magnesium hydroxide, traZODone             Discharge Planning:              -- Social work and case management to assist with discharge planning and identification of  hospital follow-up needs prior to discharge             -- Estimated LOS: 2-3 days             -- Discharge Concerns: Need to establish a safety plan; Medication compliance and effectiveness             -- Discharge Goals: Return home with outpatient referrals for mental health follow-up including medication management/psychotherapy  Total Time Spent in Direct Patient Care:  I personally spent 35 minutes on the unit in direct patient care. The direct patient care time included face-to-face time with the patient, reviewing the patient's chart, communicating with other professionals, and coordinating care. Greater than 50% of this time was  spent in counseling or coordinating care with the patient regarding goals of hospitalization, psycho-education, and discharge planning needs.     Thalia Party, MD 12/10/2021, 11:32 AM

## 2021-12-11 MED ORDER — ZIPRASIDONE HCL 20 MG PO CAPS
20.0000 mg | ORAL_CAPSULE | Freq: Two times a day (BID) | ORAL | Status: DC
  Administered 2021-12-11 – 2021-12-13 (×4): 20 mg via ORAL
  Filled 2021-12-11 (×9): qty 1

## 2021-12-11 NOTE — Group Note (Unsigned)
Date:  12/11/2021 Time:  9:49 AM  Group Topic/Focus:  Goals Group:   The focus of this group is to help patients establish daily goals to achieve during treatment and discuss how the patient can incorporate goal setting into their daily lives to aide in recovery. Orientation:   The focus of this group is to educate the patient on the purpose and policies of crisis stabilization and provide a format to answer questions about their admission.  The group details unit policies and expectations of patients while admitted.     Participation Level:  {BHH PARTICIPATION LEVEL:22264}  Participation Quality:  {BHH PARTICIPATION QUALITY:22265}  Affect:  {BHH AFFECT:22266}  Cognitive:  {BHH COGNITIVE:22267}  Insight: {BHH Insight2:20797}  Engagement in Group:  {BHH ENGAGEMENT IN GROUP:22268}  Modes of Intervention:  {BHH MODES OF INTERVENTION:22269}  Additional Comments:  ***  Elroy Schembri Lashawn Trueman Worlds 12/11/2021, 9:49 AM  

## 2021-12-11 NOTE — Group Note (Signed)
LCSW Group Therapy Note   Group Date: 12/11/2021 Start Time: 1300 End Time: 1400  Type of Therapy and Topic:  Group Therapy:  Stress Management   Participation Level:  Did Not Attend    Description of Group:  Patients in this group were introduced to the idea of stress and encouraged to discuss negative and positive ways to manage stress. Patients discussed specific stressors that they have in their life right now and the physical signs and symptoms associated with that stress.  Patient encouraged to come up with positive changes to assist with the stress upon discharge in order to prevent future hospitalizations.   They also worked as a group on developing a specific plan for several patients to deal with stressors through [place], psychoeducation and self care techniques   Therapeutic Goals:               1)  To discuss the positive and negative impacts of stress             2)  identify signs and symptoms of stress             3)  generate ideas for stress management             4)  offer mutual support to others regarding stress management             5)  Developing plans for ways to manage specific stressors upon discharge               Summary of Patient Progress: Did not attend    Therapeutic Modalities:   [place], [place] 12/11/2021  2:08 PM

## 2021-12-11 NOTE — Progress Notes (Signed)
   12/11/21 2045  Psych Admission Type (Psych Patients Only)  Admission Status Voluntary  Psychosocial Assessment  Patient Complaints None  Eye Contact Fair  Facial Expression Animated;Anxious  Affect Appropriate to circumstance  Speech Logical/coherent  Interaction Assertive  Motor Activity Slow  Appearance/Hygiene Unremarkable  Behavior Characteristics Cooperative  Mood Pleasant  Aggressive Behavior  Effect No apparent injury  Thought Process  Coherency WDL  Content WDL  Delusions WDL  Perception WDL  Hallucination None reported or observed  Judgment WDL  Confusion WDL  Danger to Self  Current suicidal ideation? Denies  Danger to Others  Danger to Others None reported or observed

## 2021-12-11 NOTE — Progress Notes (Signed)
[facility] MD Progress Note  12/11/2021 4:05 PM Devon Lopez  MRN:  [id number]  Subjective:   Patient is a 21 year old male with a past psychiatric history of bipolar disorder, GAD, PTSD, and (reported) ADHD, who was admitted to the psychiatric unit from [facility] emergency department after suicide attempt via cutting his wrists and arms while drinking alcohol.    Yesterday the team made the following recommendations: -Continue Geodon 40 mg twice daily with meals -Continue modafinil 100 mg once daily  On my assessment today, the patient reports feeling less depressed.  He reports daytime sedation is less and starting modafinil, but he continues to report that the 40 mg twice daily doses Geodon seems to be too much.  He otherwise denies having side effects to current psychiatric medications.  He reports anxiety is less.  Reports his sleep is better.  Reports that appetite is okay.  Denies having any suicidal thoughts today.  Denies HI.  Denies any other psychotic symptoms. We discussed decreasing Geodon back to 20 mg twice a day for mood stability, continue modafinil at current dose, and monitoring responses to these medication changes for the next 24 hours, then after considering discharge-is agreeable.  Principal Problem: Bipolar 2 disorder, major depressive episode (HCC) Diagnosis: Principal Problem:   Bipolar 2 disorder, major depressive episode (HCC) Active Problems:   Chronic post-traumatic stress disorder (PTSD)   Delta-9-tetrahydrocannabinol (THC) dependence (HCC)   GAD (generalized anxiety disorder)  Total Time spent with patient: 20 minutes  Past Psychiatric History:  Bipolar disorder, GAD, PTSD, reported history of ADHD This is the patient's third psychiatric hospitalization.  He does report a history of suicide attempts prior to this admission when he was 21 years old and drank bleach.  Past psychiatric medication history: Seroquel, lamotrigine, Adderall, lithium, Abilify,  gabapentin  Past Medical History:  Past Medical History:  Diagnosis Date   Anxiety    Bipolar 2 disorder (HCC)    Depression    PTSD (post-traumatic stress disorder)    Suicide attempt (HCC)    TBI (traumatic brain injury) (HCC) 2016   History reviewed. No pertinent surgical history. Family History: History reviewed. No pertinent family history. Family Psychiatric  History:  Cousin diagnosed with schizophrenia.  Family history of suicide: Cousin that was diagnosed with schizophrenia committed suicide. Social History:  Social History   Substance and Sexual Activity  Alcohol Use Never     Social History   Substance and Sexual Activity  Drug Use Yes   Types: Marijuana    Social History   Socioeconomic History   Marital status: Single    Spouse name: Not on file   Number of children: Not on file   Years of education: Not on file   Highest education level: Not on file  Occupational History   Not on file  Tobacco Use   Smoking status: Never   Smokeless tobacco: Never  Vaping Use   Vaping Use: Never used  Substance and Sexual Activity   Alcohol use: Never   Drug use: Yes    Types: Marijuana   Sexual activity: Not Currently  Other Topics Concern   Not on file  Social History Narrative   Not on file   Social Determinants of Health   Financial Resource Strain: Not on file  Food Insecurity: No Food Insecurity (12/07/2021)   Hunger Vital Sign    Worried About Running Out of Food in the Last Year: Never true    Ran Out of Food in the Last  Year: Never true  Transportation Needs: No Transportation Needs (12/07/2021)   PRAPARE - Administrator, Civil Service (Medical): No    Lack of Transportation (Non-Medical): No  Physical Activity: Not on file  Stress: Not on file  Social Connections: Not on file   Additional Social History:                         Sleep: Fair  Appetite:  Fair  Current Medications: Current Facility-Administered  Medications  Medication Dose Route Frequency Provider Last Rate Last Admin   acetaminophen (TYLENOL) tablet 650 mg  650 mg Oral Q6H PRN Jackelyn Poling, NP       alum & mag hydroxide-simeth (MAALOX/MYLANTA) 200-200-20 MG/5ML suspension 30 mL  30 mL Oral Q4H PRN Nira Conn A, NP       hydrOXYzine (ATARAX) tablet 25 mg  25 mg Oral TID PRN Nira Conn A, NP   25 mg at 12/09/21 2105   magnesium hydroxide (MILK OF MAGNESIA) suspension 30 mL  30 mL Oral Daily PRN Jackelyn Poling, NP       modafinil (PROVIGIL) tablet 100 mg  100 mg Oral Daily Nataline Basara, Harrold Donath, MD   100 mg at 12/11/21 1610   neomycin-bacitracin-polymyxin (NEOSPORIN) ointment   Topical BID Phineas Inches, MD   Given at 12/08/21 2158   traZODone (DESYREL) tablet 50 mg  50 mg Oral QHS PRN Avana Kreiser, Harrold Donath, MD   50 mg at 12/09/21 2105   ziprasidone (GEODON) capsule 20 mg  20 mg Oral BID WC Athanasius Kesling, Harrold Donath, MD        Lab Results:  No results found for this or any previous visit (from the past 48 hour(s)).   Blood Alcohol level:  Lab Results  Component Value Date   ETH <10 12/06/2021   ETH <10 08/19/2021    Metabolic Disorder Labs: Lab Results  Component Value Date   HGBA1C 5.2 12/08/2021   MPG 102.54 12/08/2021   MPG 102.54 08/21/2021   No results found for: "PROLACTIN" Lab Results  Component Value Date   CHOL 204 (H) 12/08/2021   TRIG 335 (H) 12/08/2021   HDL 32 (L) 12/08/2021   CHOLHDL 6.4 12/08/2021   VLDL 67 (H) 12/08/2021   LDLCALC 105 (H) 12/08/2021   LDLCALC 86 08/23/2021    Physical Findings: AIMS:  , ,  ,  ,    CIWA:  CIWA-Ar Total: 2 COWS:     Musculoskeletal: Strength & Muscle Tone: within normal limits Gait & Station: normal Patient leans: N/A  Psychiatric Specialty Exam:  Presentation  General Appearance:  Casual  Eye Contact: Fair  Speech: Normal Rate  Speech Volume: Normal  Handedness: Right   Mood and Affect  Mood: Anxious; less depressed  Affect: Congruent;  Full Range   Thought Process  Thought Processes: Linear  Descriptions of Associations:Intact  Orientation:Full (Time, Place and Person)  Thought Content:Logical  History of Schizophrenia/Schizoaffective disorder:No  Duration of Psychotic Symptoms: NA  Hallucinations:No data recorded Denies AH, denies VH  Ideas of Reference:None  Suicidal Thoughts:Suicidal Thoughts: Denies  Homicidal Thoughts:No data recorded Denies  Sensorium  Memory: Immediate Good; Recent Good; Remote Good  Judgment: Fair  Insight: Fair   Art therapist  Concentration: Fair  Attention Span: Fair  Recall: Good  Fund of Knowledge: Good  Language: Good   Psychomotor Activity  Psychomotor Activity: No data recorded No EPS on exam today.  Denies having any akathisia.  No akathisia observed.  Assets  Assets: Manufacturing systems engineer; Desire for Improvement; Financial Resources/Insurance; Housing; Intimacy; Resilience; Social Support; Transportation   Sleep  Sleep: No data recorded    Physical Exam: Physical Exam Vitals reviewed.  Pulmonary:     Effort: Pulmonary effort is normal.  Neurological:     Motor: No weakness.     Gait: Gait normal.    Review of Systems  Neurological:  Negative for dizziness and tremors.  Psychiatric/Behavioral:  Positive for depression and substance abuse. Negative for hallucinations, memory loss and suicidal ideas. The patient is nervous/anxious. The patient does not have insomnia.    Blood pressure 125/85, pulse 83, temperature 97.7 F (36.5 C), temperature source Oral, resp. rate 18, height 6' (1.829 m), weight 92.9 kg, SpO2 100 %. Body mass index is 27.78 kg/m.   Treatment Plan Summary:   Diagnoses / Active Problems:   -Bipolar disorder, type II, current episode is depressed, without psychotic features -GAD -PTSD -Reported history of ADHD -Delta 8 use -Reported history of TBI   PLAN: Safety and Monitoring:             --   Voluntary admission to inpatient psychiatric unit for safety, stabilization and treatment             -- Daily contact with patient to assess and evaluate symptoms and progress in treatment             -- Patient's case to be discussed in multi-disciplinary team meeting             -- Observation Level : q15 minute checks             -- Vital signs:  q12 hours             -- Precautions: suicide, elopement, and assault   2. Psychiatric Diagnoses and Treatment:               -Decrease Geodon from 40 mg bid to 20 mg twice daily with food-for bipolar disorder.  Decrease dose due to sedation.  Patient had previously tried Seroquel, which caused too much sedation, and Abilify which caused akathisia. -Patient declined to restart gabapentin at this time due to not having PCP to continue this prescription after discharge.  Patient offered PCP referral. -Continue modafinil 100 mg once daily tomorrow for chronic fatigue.  This has been effective.  We previously discussed this is evidence-based for treating depression and low energy in patients with bipolar mood disorder but is not FDA approved for this.  We discussed risks versus benefits of starting this medication, the patient has given verbal consent to start this medication.  He denies any cardiac history.  June 2023 EKG reviewed.  --  The risks/benefits/side-effects/alternatives to this medication were discussed in detail with the patient and time was given for questions. The patient consents to medication trial.                -- Metabolic profile and EKG monitoring obtained while on an atypical antipsychotic (BMI: Lipid Panel: HbgA1c: QTc:)              -- Encouraged patient to participate in unit milieu and in scheduled group therapies                            3. Medical Issues Being Addressed:          4. Discharge Planning:              --  Social work and case management to assist with discharge planning and identification of hospital  follow-up needs prior to discharge             -- Estimated LOS: 5-7 days             -- Discharge Concerns: Need to establish a safety plan; Medication compliance and effectiveness             -- Discharge Goals: Return home with outpatient referrals for mental health follow-up including medication management/psychotherapy     Christoper Allegra, MD 12/11/2021, 4:05 PM  Total Time Spent in Direct Patient Care:  I personally spent 35 minutes on the unit in direct patient care. The direct patient care time included face-to-face time with the patient, reviewing the patient's chart, communicating with other professionals, and coordinating care. Greater than 50% of this time was spent in counseling or coordinating care with the patient regarding goals of hospitalization, psycho-education, and discharge planning needs.   Janine Limbo, MD Psychiatrist

## 2021-12-11 NOTE — Progress Notes (Signed)
Pt compliant with medications and tx. Pt did decline neosporin stating wound was resolved and no longer indicated. Pt denies current si/hi/self harm thoughts as well as a/v hallucinations. Q 15 minute checks ongoing for safety.

## 2021-12-11 NOTE — Group Note (Unsigned)
Date:  12/11/2021 Time:  10:05 AM  Group Topic/Focus:  Goals Group:   The focus of this group is to help patients establish daily goals to achieve during treatment and discuss how the patient can incorporate goal setting into their daily lives to aide in recovery.     Participation Level:  {BHH PARTICIPATION LEVEL:22264}  Participation Quality:  {BHH PARTICIPATION QUALITY:22265}  Affect:  {BHH AFFECT:22266}  Cognitive:  {BHH COGNITIVE:22267}  Insight: {BHH Insight2:20797}  Engagement in Group:  {BHH ENGAGEMENT IN GROUP:22268}  Modes of Intervention:  {BHH MODES OF INTERVENTION:22269}  Additional Comments:  ***  Margarit Minshall Lashawn Alaylah Heatherington 12/11/2021, 10:05 AM  

## 2021-12-11 NOTE — Group Note (Signed)
Recreation Therapy Group Note   Group Topic:Stress Management  Group Date: 12/11/2021 Start Time: 0935 End Time: 0955 Facilitators: Simara Rhyner-McCall, LRT,CTRS Location: [address] Day[address]   Goal Area(s) Addresses:  Patient will identify positive stress management techniques. Patient will identify benefits of using stress management post d/c.   Group Description:  Meditation.  LRT played a meditation for patients from the Calm app that focused on strengthening resilience during tough situations to create peace moving forward.  Patients were to listen and follow along as meditation played to fully engage in the concept being offered.  LRT and patients discussed other areas to engage in stress management such as Youtube, scripts, apps and the Internet.     Affect/Mood: Appropriate   Participation Level: Engaged   Participation Quality: Independent   Behavior: Appropriate   Speech/Thought Process: Focused   Insight: Good   Judgement: Good   Modes of Intervention: App   Patient Response to Interventions:  Engaged   Education Outcome:  Acknowledges education and In group clarification offered    Clinical Observations/Individualized Feedback: Pt attended and participated in group session.     Plan: Continue to engage patient in RT group sessions 2-3x/week.   Jakylah Bassinger-McCall, LRT,CTRS 12/11/2021 12:10 PM

## 2021-12-11 NOTE — Group Note (Signed)
Occupational Therapy Group Note  Group Topic:Coping Skills  Group Date: 12/11/2021 Start Time: 1400 End Time: 1440 Facilitators: Brantley Stage, OT   Group Description: Group encouraged increased engagement and participation through discussion and activity focused on "Coping Ahead." Patients were split up into teams and selected a card from a stack of positive coping strategies. Patients were instructed to act out/charade the coping skill for other peers to guess and receive points for their team. Discussion followed with a focus on identifying additional positive coping strategies and patients shared how they were going to cope ahead over the weekend while continuing hospitalization stay.  Therapeutic Goal(s): Identify positive vs negative coping strategies. Identify coping skills to be used during hospitalization vs coping skills outside of hospital/at home Increase participation in therapeutic group environment and promote engagement in treatment   Participation Level: Active   Participation Quality: Independent   Behavior: Cooperative   Speech/Thought Process: Focused   Affect/Mood: Appropriate   Insight: Good   Judgement: Good   Individualization: pt was active and engaged in their participation of group discussion/activity. New skills were identified  Modes of Intervention: Discussion and Education  Patient Response to Interventions:  Attentive   Plan: Continue to engage patient in OT groups 2 - 3x/week.  12/11/2021  Brantley Stage, OT  Devon Lopez, OT

## 2021-12-12 NOTE — Group Note (Unsigned)
Date:  12/12/2021 Time:  11:13 AM  Group Topic/Focus:  Goals Group:   The focus of this group is to help patients establish daily goals to achieve during treatment and discuss how the patient can incorporate goal setting into their daily lives to aide in recovery. Orientation:   The focus of this group is to educate the patient on the purpose and policies of crisis stabilization and provide a format to answer questions about their admission.  The group details unit policies and expectations of patients while admitted.     Participation Level:  {BHH PARTICIPATION LEVEL:22264}  Participation Quality:  {BHH PARTICIPATION QUALITY:22265}  Affect:  {BHH AFFECT:22266}  Cognitive:  {BHH COGNITIVE:22267}  Insight: {BHH Insight2:20797}  Engagement in Group:  {BHH ENGAGEMENT IN GROUP:22268}  Modes of Intervention:  {BHH MODES OF INTERVENTION:22269}  Additional Comments:  ***  [name] [name] 12/12/2021, 11:13 AM  

## 2021-12-12 NOTE — Progress Notes (Signed)
Adult Psychoeducational Group Note  Date:  12/12/2021 Time:  8:26 PM  Group Topic/Focus:  Wrap-Up Group:   The focus of this group is to help patients review their daily goal of treatment and discuss progress on daily workbooks.  Participation Level:  Active  Participation Quality:  Appropriate  Affect:  Appropriate  Cognitive:  Appropriate  Insight: Appropriate  Engagement in Group:  Engaged  Modes of Intervention:  Discussion  Additional Comments:  Pt stated his goal for the day was to get ready for discharge.  Pt stated he filled out suicide safety plan.  Pt met goal.  Elise Benne 12/12/2021, 8:26 PM

## 2021-12-12 NOTE — Progress Notes (Signed)
   12/12/21 0800  Psych Admission Type (Psych Patients Only)  Admission Status Voluntary  Psychosocial Assessment  Patient Complaints None  Eye Contact Fair  Facial Expression Animated;Anxious  Affect Appropriate to circumstance  Speech Logical/coherent  Interaction Assertive  Motor Activity Slow  Appearance/Hygiene Unremarkable  Behavior Characteristics Cooperative  Mood Pleasant  Thought Process  Coherency WDL  Content WDL  Delusions None reported or observed  Perception WDL  Hallucination None reported or observed  Judgment WDL  Confusion WDL  Danger to Self  Current suicidal ideation? Denies  Danger to Others  Danger to Others None reported or observed

## 2021-12-12 NOTE — BHH Group Notes (Addendum)
Spiritual care group on grief and loss facilitated by chaplain Janne Napoleon, [facility]   Group Goal:   Support / Education around grief and loss   Members engage in facilitated group support and psycho-social education.   Group Description:   Following introductions and group rules, group members engaged in facilitated group dialog and support around topic of loss, with particular support around experiences of loss in their lives. Group Identified types of loss (relationships / self / things) and identified patterns, circumstances, and changes that precipitate losses. Reflected on thoughts / feelings around loss, normalized grief responses, and recognized variety in grief experience. Group noted Worden's four tasks of grief in discussion.   Group drew on Adlerian / Rogerian, narrative, MI,   Patient Progress: Pt was introduced to topic and he elected to not stay for the group.

## 2021-12-12 NOTE — Progress Notes (Signed)
   12/12/21 2130  Psych Admission Type (Psych Patients Only)  Admission Status Voluntary  Psychosocial Assessment  Patient Complaints None  Eye Contact Fair  Facial Expression Animated;Anxious  Affect Appropriate to circumstance  Speech Logical/coherent  Interaction Assertive  Motor Activity Slow  Appearance/Hygiene Unremarkable  Behavior Characteristics Cooperative  Mood Pleasant  Aggressive Behavior  Effect No apparent injury  Thought Process  Coherency WDL  Content WDL  Delusions WDL  Perception WDL  Hallucination None reported or observed  Judgment WDL  Confusion WDL  Danger to Self  Current suicidal ideation? Denies  Danger to Others  Danger to Others None reported or observed

## 2021-12-12 NOTE — Progress Notes (Signed)
   12/12/21 0530  Sleep  Number of Hours 6.5

## 2021-12-12 NOTE — Progress Notes (Signed)
[facility] MD Progress Note  12/12/2021 11:23 AM Devon Lopez  MRN:  [id number]  Subjective:   Patient is a 21 year old male with a past psychiatric history of bipolar disorder, GAD, PTSD, and (reported) ADHD, who was admitted to the psychiatric unit from [facility] emergency department after suicide attempt via cutting his wrists and arms while drinking alcohol.    Yesterday the team made the following recommendations: -Decrease Geodon to 20 mg twice daily with meals -Continue modafinil 100 mg once daily  Per nursing - pt is doing much better, is "doing well" "and is a whol enew person".   On my assessment today, the patient reports that mood is euthymic. Reports that daytime fatigue resolved with decreasing geodon dose and starting modafinil.  He otherwise denies having side effects to current psychiatric medications.  He reports anxiety is less and at a level he can control day to day.  Reports his sleep is better.  Reports that appetite is okay.  Denies having any suicidal thoughts today.  Denies HI.  Denies any other psychotic symptoms.  We discussed dc planning for tomorrow - pt is agreeable.   Principal Problem: Bipolar 2 disorder, major depressive episode (HCC) Diagnosis: Principal Problem:   Bipolar 2 disorder, major depressive episode (HCC) Active Problems:   Chronic post-traumatic stress disorder (PTSD)   Delta-9-tetrahydrocannabinol (THC) dependence (HCC)   GAD (generalized anxiety disorder)  Total Time spent with patient: 20 minutes  Past Psychiatric History:  Bipolar disorder, GAD, PTSD, reported history of ADHD This is the patient's third psychiatric hospitalization.  He does report a history of suicide attempts prior to this admission when he was 21 years old and drank bleach.  Past psychiatric medication history: Seroquel, lamotrigine, Adderall, lithium, Abilify, gabapentin  Past Medical History:  Past Medical History:  Diagnosis Date   Anxiety    Bipolar 2 disorder  (HCC)    Depression    PTSD (post-traumatic stress disorder)    Suicide attempt (HCC)    TBI (traumatic brain injury) (HCC) 2016   History reviewed. No pertinent surgical history. Family History: History reviewed. No pertinent family history. Family Psychiatric  History:  Cousin diagnosed with schizophrenia.  Family history of suicide: Cousin that was diagnosed with schizophrenia committed suicide. Social History:  Social History   Substance and Sexual Activity  Alcohol Use Never     Social History   Substance and Sexual Activity  Drug Use Yes   Types: Marijuana    Social History   Socioeconomic History   Marital status: Single    Spouse name: Not on file   Number of children: Not on file   Years of education: Not on file   Highest education level: Not on file  Occupational History   Not on file  Tobacco Use   Smoking status: Never   Smokeless tobacco: Never  Vaping Use   Vaping Use: Never used  Substance and Sexual Activity   Alcohol use: Never   Drug use: Yes    Types: Marijuana   Sexual activity: Not Currently  Other Topics Concern   Not on file  Social History Narrative   Not on file   Social Determinants of Health   Financial Resource Strain: Not on file  Food Insecurity: No Food Insecurity (12/07/2021)   Hunger Vital Sign    Worried About Running Out of Food in the Last Year: Never true    Ran Out of Food in the Last Year: Never true  Transportation Needs: No Transportation Needs (  12/07/2021)   PRAPARE - Administrator, Civil Service (Medical): No    Lack of Transportation (Non-Medical): No  Physical Activity: Not on file  Stress: Not on file  Social Connections: Not on file   Additional Social History:                         Sleep: Fair  Appetite:  Fair  Current Medications: Current Facility-Administered Medications  Medication Dose Route Frequency Provider Last Rate Last Admin   acetaminophen (TYLENOL) tablet 650 mg   650 mg Oral Q6H PRN Jackelyn Poling, NP       alum & mag hydroxide-simeth (MAALOX/MYLANTA) 200-200-20 MG/5ML suspension 30 mL  30 mL Oral Q4H PRN Nira Conn A, NP       hydrOXYzine (ATARAX) tablet 25 mg  25 mg Oral TID PRN Nira Conn A, NP   25 mg at 12/09/21 2105   magnesium hydroxide (MILK OF MAGNESIA) suspension 30 mL  30 mL Oral Daily PRN Jackelyn Poling, NP       modafinil (PROVIGIL) tablet 100 mg  100 mg Oral Daily Maximus Hoffert, Harrold Donath, MD   100 mg at 12/12/21 0751   neomycin-bacitracin-polymyxin (NEOSPORIN) ointment   Topical BID Phineas Inches, MD   1 Application at 12/12/21 0751   traZODone (DESYREL) tablet 50 mg  50 mg Oral QHS PRN Latravious Levitt, Harrold Donath, MD   50 mg at 12/09/21 2105   ziprasidone (GEODON) capsule 20 mg  20 mg Oral BID WC Fergie Sherbert, Harrold Donath, MD   20 mg at 12/12/21 0751    Lab Results:  No results found for this or any previous visit (from the past 48 hour(s)).   Blood Alcohol level:  Lab Results  Component Value Date   ETH <10 12/06/2021   ETH <10 08/19/2021    Metabolic Disorder Labs: Lab Results  Component Value Date   HGBA1C 5.2 12/08/2021   MPG 102.54 12/08/2021   MPG 102.54 08/21/2021   No results found for: "PROLACTIN" Lab Results  Component Value Date   CHOL 204 (H) 12/08/2021   TRIG 335 (H) 12/08/2021   HDL 32 (L) 12/08/2021   CHOLHDL 6.4 12/08/2021   VLDL 67 (H) 12/08/2021   LDLCALC 105 (H) 12/08/2021   LDLCALC 86 08/23/2021    Physical Findings: AIMS: Facial and Oral Movements Muscles of Facial Expression: None, normal Lips and Perioral Area: None, normal Jaw: None, normal Tongue: None, normal,Extremity Movements Upper (arms, wrists, hands, fingers): None, normal Lower (legs, knees, ankles, toes): None, normal, Trunk Movements Neck, shoulders, hips: None, normal, Overall Severity Severity of abnormal movements (highest score from questions above): None, normal Incapacitation due to abnormal movements: None, normal Patient's  awareness of abnormal movements (rate only patient's report): No Awareness, Dental Status Current problems with teeth and/or dentures?: No Does patient usually wear dentures?: No  CIWA:  CIWA-Ar Total: 2 COWS:     Musculoskeletal: Strength & Muscle Tone: within normal limits Gait & Station: normal Patient leans: N/A  Psychiatric Specialty Exam:  Presentation  General Appearance:  Casual  Eye Contact: Fair  Speech: Normal Rate  Speech Volume: Normal  Handedness: Right   Mood and Affect  Mood: Euthymic  Affect: Congruent; Full Range   Thought Process  Thought Processes: Linear  Descriptions of Associations:Intact  Orientation:Full (Time, Place and Person)  Thought Content:Logical  History of Schizophrenia/Schizoaffective disorder:No  Duration of Psychotic Symptoms: NA  Hallucinations:No data recorded Denies AH, denies VH  Ideas of  Reference:None  Suicidal Thoughts:Suicidal Thoughts: Denies  Homicidal Thoughts:No data recorded Denies  Sensorium  Memory: Immediate Good; Recent Good; Remote Good  Judgment: Fair  Insight: Fair   Materials engineer: Fair  Attention Span: Fair  Recall: Good  Fund of Knowledge: Good  Language: Good   Psychomotor Activity  Psychomotor Activity: No data recorded No EPS on exam today.  Denies having any akathisia.  No akathisia observed.  Assets  Assets: Armed forces logistics/support/administrative officer; Desire for Improvement; Financial Resources/Insurance; Housing; Intimacy; Resilience; Social Support; Transportation   Sleep  Sleep: No data recorded Good    Physical Exam: Physical Exam Vitals reviewed.  Pulmonary:     Effort: Pulmonary effort is normal.  Neurological:     Motor: No weakness.     Gait: Gait normal.    Review of Systems  Neurological:  Negative for dizziness and tremors.  Psychiatric/Behavioral:  Positive for depression and substance abuse. Negative for hallucinations,  memory loss and suicidal ideas. The patient is nervous/anxious. The patient does not have insomnia.    Blood pressure 136/80, pulse 79, temperature (!) 97.5 F (36.4 C), temperature source Oral, resp. rate 18, height 6' (1.829 m), weight 92.9 kg, SpO2 97 %. Body mass index is 27.78 kg/m.   Treatment Plan Summary:   Diagnoses / Active Problems:   -Bipolar disorder, type II, current episode is depressed, without psychotic features -GAD -PTSD -Reported history of ADHD -Delta 8 use -Reported history of TBI   PLAN: Safety and Monitoring:             --  Voluntary admission to inpatient psychiatric unit for safety, stabilization and treatment             -- Daily contact with patient to assess and evaluate symptoms and progress in treatment             -- Patient's case to be discussed in multi-disciplinary team meeting             -- Observation Level : q15 minute checks             -- Vital signs:  q12 hours             -- Precautions: suicide, elopement, and assault   2. Psychiatric Diagnoses and Treatment:               -Continue Geodon 20 mg twice daily with food-for bipolar disorder. Patient had previously tried Seroquel, which caused too much sedation, and Abilify which caused akathisia. -Patient declined to restart gabapentin at this time due to not having PCP to continue this prescription after discharge.  Patient offered PCP referral. -Continue modafinil 100 mg once daily tomorrow for chronic fatigue.  This has been effective.  We previously discussed this is evidence-based for treating depression and low energy in patients with bipolar mood disorder but is not FDA approved for this.  We discussed risks versus benefits of starting this medication, the patient has given verbal consent to start this medication.  He denies any cardiac history.  June 2023 EKG reviewed.  --  The risks/benefits/side-effects/alternatives to this medication were discussed in detail with the patient and  time was given for questions. The patient consents to medication trial.                -- Metabolic profile and EKG monitoring obtained while on an atypical antipsychotic (BMI: Lipid Panel: HbgA1c: QTc:)              --  Encouraged patient to participate in unit milieu and in scheduled group therapies                            3. Medical Issues Being Addressed:          4. Discharge Planning:              -- Social work and case management to assist with discharge planning and identification of hospital follow-up needs prior to discharge             -- Estimated LOS: 5-7 days             -- Discharge Concerns: Need to establish a safety plan; Medication compliance and effectiveness             -- Discharge Goals: Return home with outpatient referrals for mental health follow-up including medication management/psychotherapy     Cristy Hilts, MD 12/12/2021, 11:23 AM  Total Time Spent in Direct Patient Care:  I personally spent 25 minutes on the unit in direct patient care. The direct patient care time included face-to-face time with the patient, reviewing the patient's chart, communicating with other professionals, and coordinating care. Greater than 50% of this time was spent in counseling or coordinating care with the patient regarding goals of hospitalization, psycho-education, and discharge planning needs.   Phineas Inches, MD Psychiatrist

## 2021-12-13 MED ORDER — ZIPRASIDONE HCL 20 MG PO CAPS: 20.0000 mg | ORAL_CAPSULE | Freq: Two times a day (BID) | ORAL | 0 refills | Status: DC

## 2021-12-13 MED ORDER — BACITRACIN-NEOMYCIN-POLYMYXIN OINTMENT TUBE: 1.0000 | TOPICAL_OINTMENT | Freq: Two times a day (BID) | CUTANEOUS | Status: DC

## 2021-12-13 MED ORDER — TRAZODONE HCL 50 MG PO TABS: 50.0000 mg | ORAL_TABLET | Freq: Every evening | ORAL | 0 refills | Status: DC | PRN

## 2021-12-13 MED ORDER — MODAFINIL 100 MG PO TABS: 100.0000 mg | ORAL_TABLET | Freq: Every day | ORAL | 0 refills | Status: DC

## 2021-12-13 NOTE — Progress Notes (Signed)
Pt discharged to home at 11am. Pt removed all belongings, valuables, and presciptions. Pt verbalized understanding of medications and discharge instructions. Pt denies SI/HI/self harm thoughts as well as A/V hallucinations at this time. Pt left facility by private vehicle with girlfriend.

## 2021-12-13 NOTE — Progress Notes (Signed)
  [facility] Adult Case Management Discharge Plan :  Will you be returning to the same living situation after discharge:  Yes,  Home  At discharge, do you have transportation home?: Yes,  Girlfriend  Do you have the ability to pay for your medications: Yes,  Employment and [employer]   Release of information consent forms completed and in the chart;  Patient's signature needed at discharge.  Patient to Follow up at:  Follow-up Information     [facility]. Go on 12/18/2021.   Why: You have a hospital follow up appointment for therapy and medication management services on 12/18/21 at 2:00 pm.  This appointment will be held in person. Contact information: [address] [place] [address] [phone]                 Next level of care provider has access to [facility] and Suicide Prevention discussed: Yes,  with patient and girlfriend      Has patient been referred to the Quitline?: N/A patient is not a smoker  Patient has been referred for addiction treatment: Pt. refused referral  Darleen Crocker, [place] 12/13/2021, 9:07 AM

## 2021-12-13 NOTE — [facility] Suicide Risk Assessment (Signed)
[facility] Discharge Suicide Risk Assessment   Principal Problem: Bipolar 2 disorder, major depressive episode (HCC) Discharge Diagnoses: Principal Problem:   Bipolar 2 disorder, major depressive episode (HCC) Active Problems:   Chronic post-traumatic stress disorder (PTSD)   Delta-9-tetrahydrocannabinol (THC) dependence (HCC)   GAD (generalized anxiety disorder)   Total Time spent with patient: 15 minutes  Patient is a 21 year old male with a past psychiatric history of bipolar disorder, GAD, PTSD, and (reported) ADHD, who was admitted to the psychiatric unit from [facility] emergency department after suicide attempt via cutting his wrists and arms while drinking alcohol.    HOSPITAL COURSE:  During the patient's hospitalization, patient had extensive initial psychiatric evaluation, and follow-up psychiatric evaluations every day.  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  Patient had previously tried Seroquel, which caused too much sedation, and Abilify which caused akathisia. -Patient declined to restart gabapentin at this time due to not having PCP to continue this prescription after discharge.  Patient offered PCP referral. -Consider starting medication for chronic fatigue such as modafinil once patient is on a reasonable dose of a mood stabilizer.  Patient reports a lot of his depressive symptoms stem from chronic daytime fatigue and apathy.   During the hospitalization, other adjustments were made to the patient's psychiatric medication regimen:  -geodon was increased to 40 mg bid with food, but decreased back to 20 mg bid with food bc of daytime sedation made worse -modafinil  100 mg once daily in the morning was started for chronic fatigue and bipolar depression  Patient's care was discussed during the interdisciplinary team meeting every day during the hospitalization.  The patient reported daytime fatigue was worse with higher dose of geodon. He otherwise denied having side effects to prescribed psychiatric medication.  Gradually, patient started adjusting to milieu. The patient was evaluated each day by a clinical provider to ascertain response to treatment. Improvement was noted by the patient's report of decreasing symptoms, improved sleep and appetite, affect, medication tolerance, behavior, and participation in unit programming.  Patient was asked each day to complete a self inventory noting mood, mental status, pain, new symptoms, anxiety and concerns.    Symptoms were reported as significantly decreased or resolved completely by discharge.   On day of discharge, the patient reports that their mood is stable. The patient denied having suicidal thoughts for more than 48 hours prior to discharge.  Patient denies having homicidal thoughts.  Patient denies having auditory hallucinations.  Patient denies any visual hallucinations or other symptoms of psychosis. The patient was motivated to continue taking medication with a goal of continued improvement in mental health.   The patient reports their target psychiatric symptoms of depression and suicidal thoughts, all responded well to the psychiatric medications, and the patient reports overall benefit other psychiatric hospitalization. Supportive psychotherapy was provided to the patient. The patient also participated in regular group therapy while hospitalized. Coping skills, problem solving as well as relaxation therapies were also part of the unit programming.  Labs were reviewed with the patient, and abnormal results were discussed with the patient.  The patient is able to verbalize their individual safety plan to  this provider.  # It is recommended to the patient to continue psychiatric  medications as prescribed, after discharge from the hospital.    # It is recommended to the patient to follow up with your outpatient psychiatric provider and PCP.  # It was discussed with the patient, the impact of alcohol, drugs, tobacco have been there overall psychiatric and medical wellbeing, and total abstinence from substance use was recommended the patient.ed.  # Prescriptions provided or sent directly to preferred pharmacy at discharge. Patient agreeable to plan. Given opportunity to ask questions. Appears to feel comfortable with discharge.    # In the event of worsening symptoms, the patient is instructed to call the crisis hotline, 911 and or go to the nearest ED for appropriate evaluation and treatment of symptoms. To follow-up with primary care provider for other medical issues, concerns and or health care needs  # Patient was discharged home with a plan to follow up as noted below.       Psychiatric Specialty Exam  Presentation  General Appearance:  Appropriate for Environment; Casual; Fairly Groomed  Eye Contact: Good  Speech: Clear and Coherent; Normal Rate  Speech Volume: Normal  Handedness: Right   Mood and Affect  Mood: Euthymic  Duration of Depression Symptoms: Greater than two weeks  Affect: Congruent; Appropriate; Full Range   Thought Process  Thought Processes: Linear  Descriptions of Associations:Intact  Orientation:Full (Time, Place and Person)  Thought Content:Logical  History of Schizophrenia/Schizoaffective disorder:No  Duration of Psychotic Symptoms:Less than six months  Hallucinations:Hallucinations: None  Ideas of Reference:None  Suicidal Thoughts:Suicidal Thoughts: No  Homicidal Thoughts:Homicidal Thoughts: No   Sensorium  Memory: Immediate Good; Recent Good; Remote Good  Judgment: Fair  Insight: Fair   Community education officer   Concentration: Fair  Attention Span: Fair  Recall: Good  Fund of Knowledge: Good  Language: Good   Psychomotor Activity  Psychomotor Activity: Psychomotor Activity: Normal (no eps on my exam today. aims score zero on day of dc.)   Assets  Assets: Communication Skills; Desire for Improvement; Financial Resources/Insurance; Housing; Intimacy; Resilience; Social Support; Transportation   Sleep  Sleep: Sleep: Fair   Physical Exam: Physical Exam ROS Blood pressure 130/85, pulse 86, temperature 97.8 F (36.6 C), temperature source Oral, resp. rate 18, height 6' (1.829 m), weight 92.9 kg, SpO2 98 %. Body mass index is 27.78 kg/m.  Mental Status Per Nursing Assessment::   On Admission:  Suicidal ideation indicated by patient, Self-harm thoughts, Self-harm behaviors  Demographic factors:  Male, Caucasian, Adolescent or young adult  Loss Factors:  Financial problems / change in socioeconomic status Historical Factors:  NA Risk Reduction Factors:  Positive social support, Positive therapeutic relationship, Sense of responsibility to family, Living with another person, especially a relative, Positive coping skills or problem solving skills    Continued Clinical Symptoms:  Bipolar d/o-mood is stable. Denying si. No psychotic symptoms.   Cognitive Features That Contribute To Risk:  None    Suicide Risk:  Mild:  There are no identifiable suicide plans, no associated intent, mild dysphoria and related symptoms, good self-control (both objective and subjective assessment), few other risk factors, and identifiable protective factors, including available and accessible social support.    Follow-up Information     [facility]. Go on 12/18/2021.   Why: You have a hospital follow up appointment for therapy and medication management services on 12/18/21 at 2:00 pm.  This appointment will be held in person. Contact information: [address] [place]  [id number] [phone]  Plan Of Care/Follow-up recommendations:   Activity: as tolerated  Diet: heart healthy  Other: -Follow-up with your outpatient psychiatric provider -instructions on appointment date, time, and address (location) are provided to you in discharge paperwork.  -Take your psychiatric medications as prescribed at discharge - instructions are provided to you in the discharge paperwork  -Follow-up with outpatient primary care doctor and other specialists -for management of preventative medicine and chronic medical disease, including: neck pain and chronic fatigue  -Testing: Follow-up with outpatient provider for abnormal lab results:  Abnormal lipid panel  -Recommend abstinence from alcohol, tobacco, and other illicit drug use at discharge.   -If your psychiatric symptoms recur, worsen, or if you have side effects to your psychiatric medications, call your outpatient psychiatric provider, 911, [phone] or go to the nearest emergency department.  -If suicidal thoughts recur, call your outpatient psychiatric provider, 911, [phone] or go to the nearest emergency department.   Cristy Hilts, MD 12/13/2021, 9:44 AM

## 2021-12-13 NOTE — Discharge Summary (Signed)
Physician Discharge Summary Note  Patient:  Devon Lopez is an 21 y.o., male MRN:  [id number] DOB:  2000-10-10 Patient phone:  [phone] (home)  Patient address:   [address] [place] [address],  Total Time spent with patient: 15 minutes  Date of Admission:  12/07/2021 Date of Discharge: 12-13-2021  Reason for Admission:      History of Present Illness:  Patient is a 21 year old male with a past psychiatric history of bipolar disorder, GAD, PTSD, and (reported) ADHD, who was admitted to the psychiatric unit from [facility] emergency department after suicide attempt via cutting his wrists and arms while drinking alcohol.     Prior to admission psychiatric medications: None (Stopped taking around July 2023)   On my assessment today, the patient reports that he stopped taking prescribed psychiatric his medications about 1 month after most recent discharge from this hospital.  He reports Abilify was causing him to feel akathetic, so he stopped this medication.  He also reports stopping gabapentin after he was unable to find a PCP to follow up with continue prescribing the gabapentin.  Patient reports predominantly depressive symptoms since discharge from hospital last time.  Denies manic episodes since last discharge.  He reports multiple psychosocial stress contributing to worsening depression and suicidal thoughts including chronic pain, worsening self-esteem, not be able to find a full-time job due to chronic pain chronic daytime fatigue.  Patient states that he had become so overwhelmed with his inability to provide for himself or hold a job that he started to have thoughts that he was not good enough for his girlfriend and not good enough to be living, and that she would be better off without him and as well.  He reports suicidal thoughts intensified to the day of suicide attempt where he drank wine, and cut himself on bilateral arms and wrist with a razor.   The patient  reports depressed mood, pervasive sadness, and anhedonia for many months.  Works feeling hopeless.  Reports he feels tired all the time due to his nerve issues and depression.  Reports that sleep is okay, without difficulty initiating or maintaining sleep.  Reports he does not snore.  Reports that appetite is decreased.  Reports continued have suicidal thoughts, passive, without any intent or plan at this time.  Denies HI.  Reports that anxiety is generalized, excessive, bothersome.  Denies any recent panic attacks.  Reports last manic episode was about 5 years ago.  Patient denies having psychotic symptoms since last discharge from the hospital, and attributed this to significantly decreased delta 8 use.     Past psychiatric history: Bipolar disorder, GAD, PTSD, reported history of ADHD This is the patient's third psychiatric hospitalization.  He does report a history of suicide attempts prior to this admission when he was 21 years old and drank bleach.  Past psychiatric medication history: Seroquel, lamotrigine, Adderall, lithium, Abilify, gabapentin   Past medical history: Neck pain after ski accident, patient reports gabapentin was helpful for this but is no longer taking due to not being able to find a PCP.  Patient was offered to restart the gabapentin but declined.  Denies seizure history.  Denies surgical history.  NKDA.   Family history: Cousin diagnosed with schizophrenia.  Family history of suicide: Cousin that was diagnosed with schizophrenia committed suicide.   Social history: Born and raised in [place].  New to [facility] around the age of 75.  Has girlfriend.  No children.  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   Substance use: Uses delta 8 about once per week, reports decrease dose 8 use since last admission.  Rare alcohol use reported.  Denies nicotine or tobacco use.  Denies recent marijuana use.  Denies other illicit drug use.      Principal Problem: Bipolar 2 disorder, major depressive  episode [facility]) Discharge Diagnoses: Principal Problem:   Bipolar 2 disorder, major depressive episode (HCC) Active Problems:   Chronic post-traumatic stress disorder (PTSD)   Delta-9-tetrahydrocannabinol (THC) dependence (HCC)   GAD (generalized anxiety disorder)   Past Medical History:  Past Medical History:  Diagnosis Date   Anxiety    Bipolar 2 disorder (HCC)    Depression    PTSD (post-traumatic stress disorder)    Suicide attempt (HCC)    TBI (traumatic brain injury) (HCC) 2016   History reviewed. No pertinent surgical history. Family History: History reviewed. No pertinent family history.  Social History:  Social History   Substance and Sexual Activity  Alcohol Use Never     Social History   Substance and Sexual Activity  Drug Use Yes   Types: Marijuana    Social History   Socioeconomic History   Marital status: Single    Spouse name: Not on file   Number of children: Not on file   Years of education: Not on file   Highest education level: Not on file  Occupational History   Not on file  Tobacco Use   Smoking status: Never   Smokeless tobacco: Never  Vaping Use   Vaping Use: Never used  Substance and Sexual Activity   Alcohol use: Never   Drug use: Yes    Types: Marijuana   Sexual activity: Not Currently  Other Topics Concern   Not on file  Social History Narrative   Not on file   Social Determinants of Health   Financial Resource Strain: Not on file  Food Insecurity: No Food Insecurity (12/07/2021)   Hunger Vital Sign    Worried About Running Out of Food in the Last Year: Never true    Ran Out of Food in the Last Year: Never true  Transportation Needs: No Transportation Needs (12/07/2021)   PRAPARE - Administrator, Civil Service (Medical): No    Lack of Transportation (Non-Medical): No  Physical Activity: Not on file  Stress: Not on file  Social Connections: Not on file    Hospital Course:    During the patient's  hospitalization, patient had extensive initial psychiatric evaluation, and follow-up psychiatric evaluations every day.   Psychiatric diagnoses provided upon initial assessment:  -Bipolar disorder, type II, current episode is depressed, without psychotic features -GAD -PTSD -Reported history of ADHD -Delta 8 use -Reported history of TBI     Patient's psychiatric medications were adjusted on admission:  -Start Geodon 20 mg twice daily with food-for bipolar disorder..  Patient had previously tried Seroquel, which caused too much sedation, and Abilify which caused akathisia. -Patient declined to restart gabapentin at this time due to not having PCP to continue this prescription after discharge.  Patient offered PCP referral. -Consider starting medication for chronic fatigue such as modafinil once patient is on a reasonable dose of a mood stabilizer.  Patient reports a lot of his depressive symptoms stem from chronic daytime fatigue and apathy.    During the hospitalization, other adjustments were made to the patient's psychiatric medication regimen:  -geodon was increased to 40 mg bid with food, but decreased back to 20 mg bid with  food bc of daytime sedation made worse -modafinil 100 mg once daily in the morning was started for chronic fatigue and bipolar depression   Patient's care was discussed during the interdisciplinary team meeting every day during the hospitalization.   The patient reported daytime fatigue was worse with higher dose of geodon. He otherwise denied having side effects to prescribed psychiatric medication.   Gradually, patient started adjusting to milieu. The patient was evaluated each day by a clinical provider to ascertain response to treatment. Improvement was noted by the patient's report of decreasing symptoms, improved sleep and appetite, affect, medication tolerance, behavior, and participation in unit programming.  Patient was asked each day to complete a self  inventory noting mood, mental status, pain, new symptoms, anxiety and concerns.     Symptoms were reported as significantly decreased or resolved completely by discharge.    On day of discharge, the patient reports that their mood is stable. The patient denied having suicidal thoughts for more than 48 hours prior to discharge.  Patient denies having homicidal thoughts.  Patient denies having auditory hallucinations.  Patient denies any visual hallucinations or other symptoms of psychosis. The patient was motivated to continue taking medication with a goal of continued improvement in mental health.    The patient reports their target psychiatric symptoms of depression and suicidal thoughts, all responded well to the psychiatric medications, and the patient reports overall benefit other psychiatric hospitalization. Supportive psychotherapy was provided to the patient. The patient also participated in regular group therapy while hospitalized. Coping skills, problem solving as well as relaxation therapies were also part of the unit programming.   Labs were reviewed with the patient, and abnormal results were discussed with the patient.   The patient is able to verbalize their individual safety plan to this provider.   # It is recommended to the patient to continue psychiatric medications as prescribed, after discharge from the hospital.     # It is recommended to the patient to follow up with your outpatient psychiatric provider and PCP.   # It was discussed with the patient, the impact of alcohol, drugs, tobacco have been there overall psychiatric and medical wellbeing, and total abstinence from substance use was recommended the patient.ed.   # Prescriptions provided or sent directly to preferred pharmacy at discharge. Patient agreeable to plan. Given opportunity to ask questions. Appears to feel comfortable with discharge.    # In the event of worsening symptoms, the patient is instructed to call  the crisis hotline, 911 and or go to the nearest ED for appropriate evaluation and treatment of symptoms. To follow-up with primary care provider for other medical issues, concerns and or health care needs   # Patient was discharged home with a plan to follow up as noted below.        Physical Findings: AIMS: Facial and Oral Movements Muscles of Facial Expression: None, normal Lips and Perioral Area: None, normal Jaw: None, normal Tongue: None, normal,Extremity Movements Upper (arms, wrists, hands, fingers): None, normal Lower (legs, knees, ankles, toes): None, normal, Trunk Movements Neck, shoulders, hips: None, normal, Overall Severity Severity of abnormal movements (highest score from questions above): None, normal Incapacitation due to abnormal movements: None, normal Patient's awareness of abnormal movements (rate only patient's report): No Awareness, Dental Status Current problems with teeth and/or dentures?: No Does patient usually wear dentures?: No  CIWA:  CIWA-Ar Total: 2 COWS:     Musculoskeletal: Strength & Muscle Tone: within normal limits Gait &  Station: normal Patient leans: N/A   Psychiatric Specialty Exam:  Presentation  General Appearance:  Appropriate for Environment; Casual; Fairly Groomed  Eye Contact: Good  Speech: Clear and Coherent; Normal Rate  Speech Volume: Normal  Handedness: Right   Mood and Affect  Mood: Euthymic  Affect: Congruent; Appropriate; Full Range   Thought Process  Thought Processes: Linear  Descriptions of Associations:Intact  Orientation:Full (Time, Place and Person)  Thought Content:Logical  History of Schizophrenia/Schizoaffective disorder:No  Duration of Psychotic Symptoms:Less than six months  Hallucinations:Hallucinations: None  Ideas of Reference:None  Suicidal Thoughts:Suicidal Thoughts: No  Homicidal Thoughts:Homicidal Thoughts: No   Sensorium  Memory: Immediate Good; Recent Good;  Remote Good  Judgment: Fair  Insight: Fair   Art therapistxecutive Functions  Concentration: Fair  Attention Span: Fair  Recall: Good  Fund of Knowledge: Good  Language: Good   Psychomotor Activity  Psychomotor Activity: Psychomotor Activity: Normal (no eps on my exam today. aims score zero on day of dc.)   Assets  Assets: Communication Skills; Desire for Improvement; Financial Resources/Insurance; Housing; Intimacy; Resilience; Social Support; Transportation   Sleep  Sleep: Sleep: Fair    Physical Exam: Physical Exam ROS Blood pressure 130/85, pulse 86, temperature 97.8 F (36.6 C), temperature source Oral, resp. rate 18, height 6' (1.829 m), weight 92.9 kg, SpO2 98 %. Body mass index is 27.78 kg/m.   Social History   Tobacco Use  Smoking Status Never  Smokeless Tobacco Never   Tobacco Cessation:  N/A, patient does not currently use tobacco products   Blood Alcohol level:  Lab Results  Component Value Date   ETH <10 12/06/2021   ETH <10 08/19/2021    Metabolic Disorder Labs:  Lab Results  Component Value Date   HGBA1C 5.2 12/08/2021   MPG 102.54 12/08/2021   MPG 102.54 08/21/2021   No results found for: "PROLACTIN" Lab Results  Component Value Date   CHOL 204 (H) 12/08/2021   TRIG 335 (H) 12/08/2021   HDL 32 (L) 12/08/2021   CHOLHDL 6.4 12/08/2021   VLDL 67 (H) 12/08/2021   LDLCALC 105 (H) 12/08/2021   LDLCALC 86 08/23/2021    See Psychiatric Specialty Exam and Suicide Risk Assessment completed by Attending Physician prior to discharge.  Discharge destination:  Home  Is patient on multiple antipsychotic therapies at discharge:  No   Has Patient had three or more failed trials of antipsychotic monotherapy by history:  No  Recommended Plan for Multiple Antipsychotic Therapies: NA  Discharge Instructions     Diet - low sodium heart healthy   Complete by: As directed    Increase activity slowly   Complete by: As directed        Allergies as of 12/13/2021       Reactions   Banana Diarrhea   Pineapple Diarrhea        Medication List     STOP taking these medications    ARIPiprazole 10 MG tablet Commonly known as: ABILIFY   gabapentin 100 MG capsule Commonly known as: NEURONTIN       TAKE these medications      Indication  modafinil 100 MG tablet Commonly known as: PROVIGIL Take 1 tablet (100 mg total) by mouth daily. Start taking on: December 14, 2021  Indication: bipolar depression   neomycin-bacitracin-polymyxin Oint Commonly known as: NEOSPORIN Apply 1 Application topically 2 (two) times daily.  Indication: lacerations   traZODone 50 MG tablet Commonly known as: DESYREL Take 1 tablet (50 mg total) by mouth at  bedtime as needed for up to 10 doses for sleep.  Indication: Trouble Sleeping   ziprasidone 20 MG capsule Commonly known as: GEODON Take 1 capsule (20 mg total) by mouth 2 (two) times daily with a meal.  Indication: Manic-Depression        Follow-up Information     [facility]. Go on 12/18/2021.   Why: You have a hospital follow up appointment for therapy and medication management services on 12/18/21 at 2:00 pm.  This appointment will be held in person. Contact information: [address] [place] [address] [phone]                 Follow-up recommendations:    Activity: as tolerated   Diet: heart healthy   Other: -Follow-up with your outpatient psychiatric provider -instructions on appointment date, time, and address (location) are provided to you in discharge paperwork.   -Take your psychiatric medications as prescribed at discharge - instructions are provided to you in the discharge paperwork   -Follow-up with outpatient primary care doctor and other specialists -for management of preventative medicine and chronic medical disease, including: neck pain and chronic fatigue   -Testing: Follow-up with outpatient provider for  abnormal lab results:  Abnormal lipid panel   -Recommend abstinence from alcohol, tobacco, and other illicit drug use at discharge.    -If your psychiatric symptoms recur, worsen, or if you have side effects to your psychiatric medications, call your outpatient psychiatric provider, 911, [phone] or go to the nearest emergency department.   -If suicidal thoughts recur, call your outpatient psychiatric provider, 911, [phone] or go to the nearest emergency department.  Signed: Cristy Hilts, MD 12/13/2021, 9:49 AM     Total Time Spent in Direct Patient Care:  I personally spent 35 minutes on the unit in direct patient care. The direct patient care time included face-to-face time with the patient, reviewing the patient's chart, communicating with other professionals, and coordinating care. Greater than 50% of this time was spent in counseling or coordinating care with the patient regarding goals of hospitalization, psycho-education, and discharge planning needs.   Phineas Inches, MD Psychiatrist

## 2021-12-13 NOTE — Progress Notes (Signed)
   12/13/21 0530  Sleep  Number of Hours 7.25

## 2022-03-19 ENCOUNTER — Encounter (HOSPITAL_BASED_OUTPATIENT_CLINIC_OR_DEPARTMENT_OTHER): Payer: Self-pay

## 2022-03-19 ENCOUNTER — Emergency Department (HOSPITAL_BASED_OUTPATIENT_CLINIC_OR_DEPARTMENT_OTHER)
Admission: EM | Admit: 2022-03-19 | Discharge: 2022-03-19 | Disposition: A | Discharge status: Home / Self Care | Payer: Medicaid Other | Attending: Emergency Medicine | Admitting: Emergency Medicine

## 2022-03-19 DIAGNOSIS — M546 Pain in thoracic spine: Secondary | ICD-10-CM | POA: Insufficient documentation

## 2022-03-19 DIAGNOSIS — M549 Dorsalgia, unspecified: Secondary | ICD-10-CM

## 2022-03-19 MED ORDER — GABAPENTIN 100 MG PO CAPS: 100.0000 mg | ORAL_CAPSULE | Freq: Three times a day (TID) | ORAL | 0 refills | Status: DC

## 2022-03-19 MED ORDER — METHYLPREDNISOLONE 4 MG PO TBPK: ORAL_TABLET | ORAL | 0 refills | Status: DC

## 2022-03-19 MED ORDER — KETOROLAC TROMETHAMINE 30 MG/ML IJ SOLN
30.0000 mg | Freq: Once | INTRAMUSCULAR | Status: AC
  Administered 2022-03-19: 30 mg via INTRAMUSCULAR
  Filled 2022-03-19: qty 1

## 2022-03-19 NOTE — Discharge Instructions (Addendum)
You were seen in the emergency department for back pain.  We have given you an injection of Toradol, powerful anti-inflammatory.  I am also a new refill of your gabapentin, and a course of steroids.  You can continue taking the naproxen, we also could use ibuprofen or Tylenol.  You can use up to 800 mg of ibuprofen every 6 hours, or up to 1000 mg of Tylenol 3 6 hours as needed.  Please follow-up with a primary care doctor once you obtain insurance. If you do not have a primary care provider, you may reach out to [facility] at [phone] to establish with one and make your first appointment.  Continue to monitor how you're doing and return to the ER for new or worsening symptoms.

## 2022-03-19 NOTE — ED Triage Notes (Signed)
States has hx of spinal/nerve issues which cause constant pain. States his pain has worsened, pain in back is worse when he moves arms.

## 2022-03-19 NOTE — ED Provider Notes (Signed)
[facility] EMERGENCY DEPARTMENT Provider Note   CSN: [id number] Arrival date & time: 03/19/22  7564     History  Chief Complaint  Patient presents with   Back Pain    Devon Lopez is a 22 y.o. male history of traumatic brain injury, PTSD, depression, anxiety, bipolar disorder who presents emergency department complaining of upper back pain for the past several days.  Patient reports having a cervical spinal injury several years ago, and has persistent back pain as well as nerve pain since.  Previously was on gabapentin, but had to stop it as he did not have insurance/PCP to refill it.  Has been trying naproxen once daily, with minimal relief.  Back pain is worse with any movement of the upper extremities.  No numbness, tingling, saddle anesthesia, urinary retention, urine or bowel continence.  No known injury.   Back Pain      Home Medications Prior to Admission medications   Medication Sig Start Date End Date Taking? Authorizing Provider  gabapentin (NEURONTIN) 100 MG capsule Take 1 capsule (100 mg total) by mouth 3 (three) times daily. 03/19/22 04/18/22 Yes Quanda Pavlicek T, PA-C  methylPREDNISolone (MEDROL DOSEPAK) 4 MG TBPK tablet Take per package instructions 03/19/22  Yes Ouita Nish T, PA-C  modafinil (PROVIGIL) 100 MG tablet Take 1 tablet (100 mg total) by mouth daily. 12/14/21 01/13/22  Massengill, Harrold Donath, MD  neomycin-bacitracin-polymyxin (NEOSPORIN) OINT Apply 1 Application topically 2 (two) times daily. 12/13/21   Massengill, Harrold Donath, MD  traZODone (DESYREL) 50 MG tablet Take 1 tablet (50 mg total) by mouth at bedtime as needed for up to 10 doses for sleep. 12/13/21   Massengill, Harrold Donath, MD  ziprasidone (GEODON) 20 MG capsule Take 1 capsule (20 mg total) by mouth 2 (two) times daily with a meal. 12/13/21 01/12/22  Massengill, Harrold Donath, MD      Allergies    Banana and Pineapple    Review of Systems   Review of Systems  Musculoskeletal:  Positive for back pain.   All other systems reviewed and are negative.   Physical Exam Updated Vital Signs BP (!) 140/67 (BP Location: Left Arm)   Pulse 79   Temp 98 F (36.7 C) (Oral)   Resp 16   Ht 6' (1.829 m)   Wt 93 kg   SpO2 99%   BMI 27.81 kg/m  Physical Exam Vitals and nursing note reviewed.  Constitutional:      Appearance: Normal appearance.  HENT:     Head: Normocephalic and atraumatic.  Eyes:     Conjunctiva/sclera: Conjunctivae normal.  Pulmonary:     Effort: Pulmonary effort is normal. No respiratory distress.  Musculoskeletal:     Comments: No midline spinal tenderness, step-offs or crepitus.  4/5 grip strength in bilateral hands due to increased pain.  Normal ROM of the bilateral upper extremities, but exacerbates back pain.  Normal sensation bilaterally.  Skin:    General: Skin is warm and dry.  Neurological:     Mental Status: He is alert.  Psychiatric:        Mood and Affect: Mood normal.        Behavior: Behavior normal.     ED Results / Procedures / Treatments   Labs (all labs ordered are listed, but only abnormal results are displayed) Labs Reviewed - No data to display  EKG None  Radiology No results found.  Procedures Procedures    Medications Ordered in ED Medications  ketorolac (TORADOL) 30 MG/ML injection 30 mg (has  no administration in time range)    ED Course/ Medical Decision Making/ A&P                           Medical Decision Making Risk Prescription drug management.   This patient is a 22 y.o. male who presents to the ED for concern of upper back pain for the past several days.   Differential diagnoses prior to evaluation: Fracture (acute/chronic), muscle strain, cauda equina, spinal stenosis, DDD, ligamentous injury, disk herniation, metastatic cancer, vertebral osteomyelitis, kidney stone, pyelonephritis, AAA, pancreatitis, bowel obstruction, meningitis.  Past Medical History / Social History / Additional history: Chart reviewed.  Pertinent results include: traumatic brain injury, PTSD, depression, anxiety, bipolar disorder  Physical Exam: Physical exam performed. The pertinent findings include: No midline spinal tenderness, step-offs or crepitus.  Pain exacerbated with movement of the bilateral upper extremities, but normal ROM.  Neurovascularly intact in bilateral upper extremities.  Medications / Treatment: Patient given injection of Toradol   Disposition: After consideration of the diagnostic results and the patients response to treatment, I feel that emergency department workup does not suggest an emergent condition requiring admission or immediate intervention beyond what has been performed at this time. Patient with back pain.  No neurological deficits and normal neuro exam.  No red flag symptoms.  No loss of bowel or bladder control.  No concern for cauda equina.  No fever, night sweats, weight loss, h/o cancer, IVDU.  The plan is: Refill gabapentin, and prescribe Medrol Dosepak.  Suspect back pain related to chronic/history of similar.  Patient says he should be getting an insurance card soon, and plans to establish with a PCP for long-term care afterwards.  The patient is safe for discharge and has been instructed to return immediately for worsening symptoms, change in symptoms or any other concerns.  Final Clinical Impression(s) / ED Diagnoses Final diagnoses:  Upper back pain    Rx / DC Orders ED Discharge Orders          Ordered    gabapentin (NEURONTIN) 100 MG capsule  3 times daily        03/19/22 1223    methylPREDNISolone (MEDROL DOSEPAK) 4 MG TBPK tablet        03/19/22 1223           Portions of this report may have been transcribed using voice recognition software. Every effort was made to ensure accuracy; however, inadvertent computerized transcription errors may be present.    Estill Cotta 03/19/22 1241    Regan Lemming, MD 03/19/22 1753

## 2023-05-07 ENCOUNTER — Emergency Department (HOSPITAL_BASED_OUTPATIENT_CLINIC_OR_DEPARTMENT_OTHER): Payer: MEDICAID | Admitting: Radiology

## 2023-05-07 ENCOUNTER — Emergency Department (HOSPITAL_BASED_OUTPATIENT_CLINIC_OR_DEPARTMENT_OTHER)
Admission: EM | Admit: 2023-05-07 | Discharge: 2023-05-07 | Disposition: A | Discharge status: Home / Self Care | Payer: MEDICAID | Attending: Emergency Medicine | Admitting: Emergency Medicine

## 2023-05-07 ENCOUNTER — Encounter (HOSPITAL_BASED_OUTPATIENT_CLINIC_OR_DEPARTMENT_OTHER): Payer: Self-pay | Admitting: Emergency Medicine

## 2023-05-07 ENCOUNTER — Emergency Department (HOSPITAL_COMMUNITY): Admission: EM | Admit: 2023-05-07 | Discharge: 2023-05-07 | Discharge status: Against Medical Advice | Payer: MEDICAID

## 2023-05-07 ENCOUNTER — Other Ambulatory Visit (HOSPITAL_BASED_OUTPATIENT_CLINIC_OR_DEPARTMENT_OTHER): Payer: Self-pay

## 2023-05-07 ENCOUNTER — Other Ambulatory Visit: Payer: Self-pay

## 2023-05-07 DIAGNOSIS — W2209XA Striking against other stationary object, initial encounter: Secondary | ICD-10-CM | POA: Insufficient documentation

## 2023-05-07 DIAGNOSIS — S62334A Displaced fracture of neck of fourth metacarpal bone, right hand, initial encounter for closed fracture: Secondary | ICD-10-CM | POA: Diagnosis present

## 2023-05-07 MED ORDER — OXYCODONE HCL 5 MG PO TABS
5.0000 mg | ORAL_TABLET | ORAL | 0 refills | Status: DC | PRN
  Filled 2023-05-07: qty 12, 2d supply, fill #0

## 2023-05-07 MED ORDER — OXYCODONE-ACETAMINOPHEN 5-325 MG PO TABS
1.0000 | ORAL_TABLET | Freq: Once | ORAL | Status: AC
  Administered 2023-05-07: 1 via ORAL
  Filled 2023-05-07: qty 1

## 2023-05-07 MED ORDER — TETANUS-DIPHTH-ACELL PERTUSSIS 5-2.5-18.5 LF-MCG/0.5 IM SUSY: 0.5000 mL | PREFILLED_SYRINGE | Freq: Once | INTRAMUSCULAR | Status: DC

## 2023-05-07 NOTE — Discharge Instructions (Signed)
 Please keep splint in place Elevate and use cold therapy Call Dr. Alda Berthold office tomorrow for follow-up this week

## 2023-05-07 NOTE — ED Notes (Signed)
 Pt did not get a sling

## 2023-05-07 NOTE — ED Triage Notes (Signed)
 Punched a tree around 9AM Deformity to middle knuckle right hand Scratched to back of hand Unknown tetanus

## 2023-05-07 NOTE — ED Provider Notes (Signed)
  EMERGENCY DEPARTMENT AT [facility] Provider Note   CSN: [id number] Arrival date & time: 05/07/23  1110     History  Chief Complaint  Patient presents with   Hand Injury    Devon Lopez is a 23 y.o. male.  HPI 23 year old male presents today complaining of pain in right hand.  He punched a tree earlier today.  He denies any other injury.  He is right-handed.  Does not know when his last tetanus shot was.    Home Medications Prior to Admission medications   Medication Sig Start Date End Date Taking? Authorizing Provider  oxyCODONE (ROXICODONE) 5 MG immediate release tablet Take 1 tablet (5 mg total) by mouth every 4 (four) hours as needed for severe pain (pain score 7-10). 05/07/23  Yes Margarita Grizzle, MD  gabapentin (NEURONTIN) 100 MG capsule Take 1 capsule (100 mg total) by mouth 3 (three) times daily. 03/19/22 04/18/22  Roemhildt, Lorin T, PA-C  methylPREDNISolone (MEDROL DOSEPAK) 4 MG TBPK tablet Take per package instructions 03/19/22   Roemhildt, Lorin T, PA-C  modafinil (PROVIGIL) 100 MG tablet Take 1 tablet (100 mg total) by mouth daily. 12/14/21 01/13/22  Massengill, Harrold Donath, MD  neomycin-bacitracin-polymyxin (NEOSPORIN) OINT Apply 1 Application topically 2 (two) times daily. 12/13/21   Massengill, Harrold Donath, MD  traZODone (DESYREL) 50 MG tablet Take 1 tablet (50 mg total) by mouth at bedtime as needed for up to 10 doses for sleep. 12/13/21   Massengill, Harrold Donath, MD  ziprasidone (GEODON) 20 MG capsule Take 1 capsule (20 mg total) by mouth 2 (two) times daily with a meal. 12/13/21 01/12/22  Massengill, Harrold Donath, MD      Allergies    Banana and Pineapple    Review of Systems   Review of Systems  Physical Exam Updated Vital Signs BP (!) 141/83 (BP Location: Left Arm)   Pulse 64   Temp 98.2 F (36.8 C)   Resp 20   SpO2 99%  Physical Exam Vitals and nursing note reviewed.  Constitutional:      Appearance: He is well-developed.  HENT:     Head: Normocephalic  and atraumatic.     Right Ear: External ear normal.     Left Ear: External ear normal.     Nose: Nose normal.  Eyes:     Extraocular Movements: Extraocular movements intact.  Neck:     Trachea: No tracheal deviation.  Pulmonary:     Effort: Pulmonary effort is normal.  Musculoskeletal:        General: Normal range of motion.     Comments: Swelling to right hand with abrasion to dorsal aspect of right hand. Sensation intact Patient with decreased extension of fingers due to pain.  Pulses are intact   Skin:    General: Skin is warm and dry.  Neurological:     Mental Status: He is alert and oriented to person, place, and time.  Psychiatric:        Mood and Affect: Mood normal.        Behavior: Behavior normal.     ED Results / Procedures / Treatments   Labs (all labs ordered are listed, but only abnormal results are displayed) Labs Reviewed - No data to display  EKG None  Radiology DG Hand Complete Right Result Date: 05/07/2023 CLINICAL DATA:  Right hand injury after punching a tree. EXAM: RIGHT HAND - COMPLETE 3+ VIEW COMPARISON:  None Available. FINDINGS: Minimally displaced transverse fracture of the distal fourth metatarsal head and neck junction.  No evidence of intra-articular extension. No evidence of dislocation. There is no evidence of arthropathy. Soft tissues swelling of the dorsal hand, overlying the MCP joints. IMPRESSION: Minimally displaced fracture of the distal fourth metatarsal head and neck junction. No evidence of intra-articular extension. Electronically Signed   By: Hart Robinsons M.D.   On: 05/07/2023 13:28    Procedures Procedures    Medications Ordered in ED Medications  Tdap (BOOSTRIX) injection 0.5 mL (has no administration in time range)  oxyCODONE-acetaminophen (PERCOCET/ROXICET) 5-325 MG per tablet 1 tablet (1 tablet Oral Given 05/07/23 1227)    ED Course/ Medical Decision Making/ A&P                                 Medical Decision  Making Amount and/or Complexity of Data Reviewed Radiology: ordered.   23 year old male with fracture of distal fourth metacarpal after punching tree No significant displacement noted on x-Cambell Rickenbach Abrasions noted to the back of hand Plan to wound care, splint placement, and will refer to follow-up with hand surgery.        Final Clinical Impression(s) / ED Diagnoses Final diagnoses:  Closed displaced fracture of neck of fourth metacarpal bone of right hand, initial encounter    Rx / DC Orders ED Discharge Orders          Ordered    oxyCODONE (ROXICODONE) 5 MG immediate release tablet  Every 4 hours PRN        05/07/23 1423              Margarita Grizzle, MD 05/07/23 1424

## 2023-07-26 ENCOUNTER — Other Ambulatory Visit: Payer: Self-pay

## 2023-07-26 ENCOUNTER — Encounter (HOSPITAL_COMMUNITY): Payer: Self-pay | Admitting: *Deleted

## 2023-07-26 ENCOUNTER — Emergency Department (HOSPITAL_COMMUNITY): Payer: MEDICAID

## 2023-07-26 ENCOUNTER — Emergency Department (HOSPITAL_COMMUNITY)
Admission: EM | Admit: 2023-07-26 | Discharge: 2023-07-26 | Disposition: A | Discharge status: Home / Self Care | Payer: MEDICAID | Attending: Emergency Medicine | Admitting: Emergency Medicine

## 2023-07-26 DIAGNOSIS — N50811 Right testicular pain: Secondary | ICD-10-CM | POA: Diagnosis present

## 2023-07-26 DIAGNOSIS — N433 Hydrocele, unspecified: Secondary | ICD-10-CM | POA: Diagnosis not present

## 2023-07-26 MED ORDER — CEFTRIAXONE SODIUM 500 MG IJ SOLR
500.0000 mg | Freq: Once | INTRAMUSCULAR | Status: AC
Start: 2023-07-26 — End: 2023-07-26
  Administered 2023-07-26: 500 mg via INTRAMUSCULAR
  Filled 2023-07-26: qty 500

## 2023-07-26 MED ORDER — DOXYCYCLINE HYCLATE 100 MG PO CAPS: 100.0000 mg | ORAL_CAPSULE | Freq: Two times a day (BID) | ORAL | 0 refills | Status: DC

## 2023-07-26 MED ORDER — OXYCODONE-ACETAMINOPHEN 5-325 MG PO TABS
1.0000 | ORAL_TABLET | Freq: Once | ORAL | Status: AC
  Administered 2023-07-26: 1 via ORAL
  Filled 2023-07-26: qty 1

## 2023-07-26 MED ORDER — IBUPROFEN 800 MG PO TABS: 800.0000 mg | ORAL_TABLET | Freq: Four times a day (QID) | ORAL | 0 refills | Status: AC | PRN

## 2023-07-26 MED ORDER — LIDOCAINE HCL (PF) 1 % IJ SOLN
2.0000 mL | Freq: Once | INTRAMUSCULAR | Status: AC
  Administered 2023-07-26: 2 mL
  Filled 2023-07-26: qty 5

## 2023-07-26 NOTE — ED Provider Notes (Signed)
 [place] EMERGENCY DEPARTMENT AT [facility] Provider Note   CSN: [id number] Arrival date & time: 07/26/23  1833     History  Chief Complaint  Patient presents with   Testicle Pain    Devon Lopez is a 23 y.o. male.  Patient presents to the emergency department for evaluation of testicle pain.  Patient reports that he has been experiencing swelling and pain of the right testicle for months.  He reports that ever since he was a teenager he had some increased size on the right side.  Over the last month the swelling has progressed and now he is experiencing pain with sexual intercourse and even sitting down.       Home Medications Prior to Admission medications   Medication Sig Start Date End Date Taking? Authorizing Provider  doxycycline (VIBRAMYCIN) 100 MG capsule Take 1 capsule (100 mg total) by mouth 2 (two) times daily. 07/26/23  Yes Sathvik Tiedt, Marine Sia, MD  ibuprofen (ADVIL) 800 MG tablet Take 1 tablet (800 mg total) by mouth every 6 (six) hours as needed for moderate pain (pain score 4-6). 07/26/23  Yes Scout Guyett, Marine Sia, MD  gabapentin  (NEURONTIN ) 100 MG capsule Take 1 capsule (100 mg total) by mouth 3 (three) times daily. 03/19/22 04/18/22  Roemhildt, Lorin T, PA-C  methylPREDNISolone  (MEDROL  DOSEPAK) 4 MG TBPK tablet Take per package instructions 03/19/22   Roemhildt, Lorin T, PA-C  modafinil  (PROVIGIL ) 100 MG tablet Take 1 tablet (100 mg total) by mouth daily. 12/14/21 01/13/22  Massengill, Elana Grayer, MD  neomycin -bacitracin -polymyxin (NEOSPORIN) OINT Apply 1 Application topically 2 (two) times daily. 12/13/21   Massengill, Elana Grayer, MD  oxyCODONE  (ROXICODONE ) 5 MG immediate release tablet Take 1 tablet (5 mg total) by mouth every 4 (four) hours as needed for severe pain (pain score 7-10). 05/07/23   Auston Blush, MD  traZODone  (DESYREL ) 50 MG tablet Take 1 tablet (50 mg total) by mouth at bedtime as needed for up to 10 doses for sleep. 12/13/21   Massengill, Elana Grayer,  MD  ziprasidone  (GEODON ) 20 MG capsule Take 1 capsule (20 mg total) by mouth 2 (two) times daily with a meal. 12/13/21 01/12/22  Massengill, Elana Grayer, MD      Allergies    Banana and Pineapple    Review of Systems   Review of Systems  Physical Exam Updated Vital Signs BP (!) 119/103 (BP Location: Right Arm)   Pulse 71   Temp 97.7 F (36.5 C)   Resp 16   Ht 6' (1.829 m)   Wt 93 kg   SpO2 100%   BMI 27.81 kg/m  Physical Exam Vitals and nursing note reviewed.  Constitutional:      General: He is not in acute distress.    Appearance: He is well-developed.  HENT:     Head: Normocephalic and atraumatic.     Mouth/Throat:     Mouth: Mucous membranes are moist.  Eyes:     General: Vision grossly intact. Gaze aligned appropriately.     Extraocular Movements: Extraocular movements intact.     Conjunctiva/sclera: Conjunctivae normal.  Cardiovascular:     Rate and Rhythm: Normal rate and regular rhythm.     Pulses: Normal pulses.     Heart sounds: Normal heart sounds, S1 normal and S2 normal. No murmur heard.    No friction rub. No gallop.  Pulmonary:     Effort: Pulmonary effort is normal. No respiratory distress.     Breath sounds: Normal breath sounds.  Abdominal:  Palpations: Abdomen is soft.     Tenderness: There is no abdominal tenderness. There is no guarding or rebound.     Hernia: No hernia is present.  Genitourinary:    Penis: Normal.      Testes:        Right: Tenderness, swelling and testicular hydrocele present.  Musculoskeletal:        General: No swelling.     Cervical back: Full passive range of motion without pain, normal range of motion and neck supple. No pain with movement, spinous process tenderness or muscular tenderness. Normal range of motion.     Right lower leg: No edema.     Left lower leg: No edema.  Skin:    General: Skin is warm and dry.     Capillary Refill: Capillary refill takes less than 2 seconds.     Findings: No ecchymosis, erythema,  lesion or wound.  Neurological:     Mental Status: He is alert and oriented to person, place, and time.     GCS: GCS eye subscore is 4. GCS verbal subscore is 5. GCS motor subscore is 6.     Cranial Nerves: Cranial nerves 2-12 are intact.     Sensory: Sensation is intact.     Motor: Motor function is intact. No weakness or abnormal muscle tone.     Coordination: Coordination is intact.  Psychiatric:        Mood and Affect: Mood normal.        Speech: Speech normal.        Behavior: Behavior normal.     ED Results / Procedures / Treatments   Labs (all labs ordered are listed, but only abnormal results are displayed) Labs Reviewed - No data to display  EKG None  Radiology US  SCROTUM Result Date: 07/26/2023 CLINICAL DATA:  Scrotal swelling and pain. EXAM: SCROTAL ULTRASOUND DOPPLER ULTRASOUND OF THE TESTICLES TECHNIQUE: Complete ultrasound examination of the testicles, epididymis, and other scrotal structures was performed. Color and spectral Doppler ultrasound were also utilized to evaluate blood flow to the testicles. COMPARISON:  None Available. FINDINGS: Right testicle Measurements: 4.0 cm x 2.8 cm x 2.7 cm. No mass or microlithiasis visualized. Left testicle Measurements: 4.5 cm x 2.2 cm x 2.3 cm. No mass or microlithiasis visualized. Right epididymis:  Normal in size and appearance. Left epididymis:  Normal in size and appearance. Hydrocele: There is a large right-sided hydrocele. A small left-sided hydrocele is also seen. Varicocele:  None visualized. Pulsed Doppler interrogation of both testes demonstrates normal low resistance arterial and venous waveforms bilaterally. IMPRESSION: Bilateral hydroceles, right greater than left. Electronically Signed   By: Virgle Grime M.D.   On: 07/26/2023 21:30    Procedures Procedures    Medications Ordered in ED Medications  cefTRIAXone (ROCEPHIN) injection 500 mg (has no administration in time range)  oxyCODONE -acetaminophen   (PERCOCET/ROXICET) 5-325 MG per tablet 1 tablet (1 tablet Oral Given 07/26/23 2010)    ED Course/ Medical Decision Making/ A&P                                 Medical Decision Making  Presents to the emergency department for evaluation of testicle pain.  Patient with progressively worsening swelling of the right testicle with pain.  Ultrasound shows no signs of torsion.  Patient does have moderate hydrocele which likely is causing discomfort.  Scrotum appears normal.  No erythema, warmth, induration or suggestion of  infection.  Empiric antibiotics, NSAIDs, follow-up with urology.        Final Clinical Impression(s) / ED Diagnoses Final diagnoses:  Hydrocele in adult    Rx / DC Orders ED Discharge Orders          Ordered    doxycycline (VIBRAMYCIN) 100 MG capsule  2 times daily        07/26/23 2328    ibuprofen (ADVIL) 800 MG tablet  Every 6 hours PRN        07/26/23 2328              Ballard Bongo, MD 07/26/23 2328

## 2023-07-26 NOTE — ED Provider Triage Note (Signed)
 Emergency Medicine Provider Triage Evaluation Note  Devon Lopez , a 23 y.o. male  was evaluated in triage.  Pt complains of right testicular pain.  Patient reports over the last few days he has had increased swelling and pain.  Patient reports that he has noticed right side was larger than the left in the past but has not had discomfort.  Review of Systems  Positive: Testicular pain Negative: fever  Physical Exam  BP 136/78   Pulse 80   Temp 98 F (36.7 C)   Resp 16   Ht 6' (1.829 m)   Wt 93 kg   SpO2 97%   BMI 27.81 kg/m  Gen:   Awake, no distress   Resp:  Normal effort  MSK:   Moves extremities without difficulty  Other:    Medical Decision Making  Medically screening exam initiated at 7:48 PM.  Appropriate orders placed.  Dorain Cicotte was informed that the remainder of the evaluation will be completed by another provider, this initial triage assessment does not replace that evaluation, and the importance of remaining in the ED until their evaluation is complete.     Sandi Crosby, PA-C 07/26/23 1949

## 2023-07-26 NOTE — ED Triage Notes (Signed)
 Pain and swelling in his testicle for 1-2 months  getting bigger  no temp

## 2023-09-01 NOTE — Progress Notes (Unsigned)
    Chief Complaint: Right-sided scrotal swelling  History of Present Illness:  Willaim Lopez is a 23 y.o. male who is seen in consultation from Pcp, No for evaluation of a right hydrocele.  The patient states that for quite a few years he has had some swelling about his right hemiscrotum.  This has increased in size since the beginning of this year and has become quite uncomfortable, limiting his activities.  He recently presented to the emergency room where hydrocele was diagnosed.  He did have a confirmatory scrotal ultrasound that revealed no testicular abnormalities but a right hydrocele.   Past Medical History:  Past Medical History:  Diagnosis Date   Anxiety    Bipolar 2 disorder (HCC)    Depression    PTSD (post-traumatic stress disorder)    Suicide attempt (HCC)    TBI (traumatic brain injury) (HCC) 2016    Past Surgical History:  No past surgical history on file.  Allergies:  Allergies  Allergen Reactions   Banana Diarrhea   Pineapple Diarrhea    Family History:  No family history on file.  Social History:  Social History   Tobacco Use   Smoking status: Never   Smokeless tobacco: Never  Vaping Use   Vaping status: Never Used  Substance Use Topics   Alcohol use: Never   Drug use: Yes    Types: Marijuana    Review of symptoms:  Constitutional:  Negative for unexplained weight loss, night sweats, fever, chills ENT:  Negative for nose bleeds, sinus pain, painful swallowing CV:  Negative for chest pain, shortness of breath, exercise intolerance, palpitations, loss of consciousness Resp:  Negative for cough, wheezing, shortness of breath GI:  Negative for nausea, vomiting, diarrhea, bloody stools GU:  Positives noted in HPI; otherwise negative for gross hematuria, dysuria, urinary incontinence Neuro:  Negative for seizures, poor balance, limb weakness, slurred speech Psych:  Negative for lack of energy, depression, anxiety Endocrine:  Negative for  polydipsia, polyuria, symptoms of hypoglycemia (dizziness, hunger, sweating) Hematologic:  Negative for anemia, purpura, petechia, prolonged or excessive bleeding, use of anticoagulants  Allergic:  Negative for difficulty breathing or choking as a result of exposure to anything; no shellfish allergy; no allergic response (rash/itch) to materials, foods  Physical exam: There were no vitals taken for this visit. GENERAL APPEARANCE:  Well appearing, well developed, well nourished, NAD HEENT: Atraumatic, Normocephalic. NECK: Normal appearance LUNGS: Normal inspiratory and expiratory excursion HEART: Regular Rate ABDOMEN: No inguinal hernias GU: Phallus normal, no lesions. Scrotal skin normal.  Right scrotal swelling.  The testicle is palpable behind the right hydrocele.  Left testicle and cord structures normal. EXTREMITIES: Moves all extremities well.  Without clubbing, cyanosis, or edema. NEUROLOGIC:  Alert and oriented x 3, normal gait, CN II-XII grossly intact.  MENTAL STATUS:  Appropriate. SKIN:  Warm, dry and intact.    Results:  I have reviewed referring/prior physicians notes--ER notes reviewed  I have reviewed urinalysis  I have reviewed prior imaging--I reviewed scrotal ultrasound images with the patient   Assessment: Symptomatic moderate-sized right hydrocele.  Patient desires management   Plan: I discussed hydrocelectomy with the patient.  Risks and complications were discussed.  He would like to proceed with this.  We will perform this on an outpatient setting at next available opportunity at [facility].

## 2023-09-02 ENCOUNTER — Ambulatory Visit (INDEPENDENT_AMBULATORY_CARE_PROVIDER_SITE_OTHER): Payer: MEDICAID | Admitting: Urology

## 2023-09-02 VITALS — BP 121/76 | HR 75 | Ht 72.0 in | Wt 185.0 lb

## 2023-09-02 DIAGNOSIS — N432 Other hydrocele: Secondary | ICD-10-CM | POA: Diagnosis not present

## 2023-09-02 LAB — MICROSCOPIC EXAMINATION: Bacteria, UA: NONE SEEN

## 2023-09-02 LAB — URINALYSIS, ROUTINE W REFLEX MICROSCOPIC
Bilirubin, UA: NEGATIVE
Glucose, UA: NEGATIVE
Ketones, UA: NEGATIVE
Leukocytes,UA: NEGATIVE
Nitrite, UA: NEGATIVE
RBC, UA: NEGATIVE
Specific Gravity, UA: 1.015 (ref 1.005–1.030)
Urobilinogen, Ur: 1 mg/dL (ref 0.2–1.0)
pH, UA: 8.5 — ABNORMAL HIGH (ref 5.0–7.5)

## 2023-09-05 NOTE — Progress Notes (Signed)
 Sent message, via epic in basket, requesting orders in epic from Careers adviser.

## 2023-09-09 ENCOUNTER — Other Ambulatory Visit: Payer: Self-pay | Admitting: Urology

## 2023-09-09 DIAGNOSIS — N432 Other hydrocele: Secondary | ICD-10-CM

## 2023-09-11 NOTE — Patient Instructions (Signed)
 SURGICAL WAITING ROOM VISITATION  Patients having surgery or a procedure may have no more than 2 support people in the waiting area - these visitors may rotate.    Children under the age of 30 must have an adult with them who is not the patient.  Visitors with respiratory illnesses are discouraged from visiting and should remain at home.  If the patient needs to stay at the hospital during part of their recovery, the visitor guidelines for inpatient rooms apply. Pre-op nurse will coordinate an appropriate time for 1 support person to accompany patient in pre-op.  This support person may not rotate.    Please refer to the [facility] website for the visitor guidelines for Inpatients (after your surgery is over and you are in a regular room).       Your procedure is scheduled on: 09/25/23   Report to [facility] Main Entrance    Report to admitting at : 6:15 AM   Call this number if you have problems the morning of surgery [phone]   Do not eat food or drink: After Midnight.  FOLLOW ANY ADDITIONAL PRE OP INSTRUCTIONS YOU RECEIVED FROM YOUR SURGEON'S OFFICE!!!   Oral Hygiene is also important to reduce your risk of infection.                                    Remember - BRUSH YOUR TEETH THE MORNING OF SURGERY WITH YOUR REGULAR TOOTHPASTE  DENTURES WILL BE REMOVED PRIOR TO SURGERY PLEASE DO NOT APPLY Poly grip OR ADHESIVES!!!   Do NOT smoke after Midnight   Stop all vitamins and herbal supplements 7 days before surgery.   Take these medicines the morning of surgery with A SIP OF WATER: NONE.                              You may not have any metal on your body including hair pins, jewelry, and body piercing             Do not wear lotions, powders, perfumes/cologne, or deodorant              Men may shave face and neck.   Do not bring valuables to the hospital. [facility] IS NOT             RESPONSIBLE   FOR VALUABLES.   Contacts, glasses, dentures or  bridgework may not be worn into surgery.   Bring small overnight bag day of surgery.   DO NOT BRING YOUR HOME MEDICATIONS TO THE HOSPITAL. PHARMACY WILL DISPENSE MEDICATIONS LISTED ON YOUR MEDICATION LIST TO YOU DURING YOUR ADMISSION IN THE HOSPITAL!    Patients discharged on the day of surgery will not be allowed to drive home.  Someone NEEDS to stay with you for the first 24 hours after anesthesia.   Special Instructions: Bring a copy of your healthcare power of attorney and living will documents the day of surgery if you haven't scanned them before.              Please read over the following fact sheets you were given: IF YOU HAVE QUESTIONS ABOUT YOUR PRE-OP INSTRUCTIONS PLEASE CALL [phone]   If you received a COVID test during your pre-op visit  it is requested that you wear a mask when out in public, stay away from anyone that  may not be feeling well and notify your surgeon if you develop symptoms. If you test positive for Covid or have been in contact with anyone that has tested positive in the last 10 days please notify you surgeon.    [facility] - Preparing for Surgery Before surgery, you can play an important role.  Because skin is not sterile, your skin needs to be as free of germs as possible.  You can reduce the number of germs on your skin by washing with CHG (chlorahexidine gluconate) soap before surgery.  CHG is an antiseptic cleaner which kills germs and bonds with the skin to continue killing germs even after washing. Please DO NOT use if you have an allergy to CHG or antibacterial soaps.  If your skin becomes reddened/irritated stop using the CHG and inform your nurse when you arrive at Short Stay. Do not shave (including legs and underarms) for at least 48 hours prior to the first CHG shower.  You may shave your face/neck. Please follow these instructions carefully:  1.  Shower with CHG Soap the night before surgery and the  morning of Surgery.  2.  If you choose to  wash your hair, wash your hair first as usual with your  normal  shampoo.  3.  After you shampoo, rinse your hair and body thoroughly to remove the  shampoo.                           4.  Use CHG as you would any other liquid soap.  You can apply chg directly  to the skin and wash                       Gently with a scrungie or clean washcloth.  5.  Apply the CHG Soap to your body ONLY FROM THE NECK DOWN.   Do not use on face/ open                           Wound or open sores. Avoid contact with eyes, ears mouth and genitals (private parts).                       Wash face,  Genitals (private parts) with your normal soap.             6.  Wash thoroughly, paying special attention to the area where your surgery  will be performed.  7.  Thoroughly rinse your body with warm water from the neck down.  8.  DO NOT shower/wash with your normal soap after using and rinsing off  the CHG Soap.                9.  Pat yourself dry with a clean towel.            10.  Wear clean pajamas.            11.  Place clean sheets on your bed the night of your first shower and do not  sleep with pets. Day of Surgery : Do not apply any lotions/deodorants the morning of surgery.  Please wear clean clothes to the hospital/surgery center.  FAILURE TO FOLLOW THESE INSTRUCTIONS MAY RESULT IN THE CANCELLATION OF YOUR SURGERY PATIENT SIGNATURE_________________________________  NURSE SIGNATURE__________________________________  ________________________________________________________________________

## 2023-09-12 ENCOUNTER — Other Ambulatory Visit: Payer: Self-pay

## 2023-09-12 ENCOUNTER — Encounter (HOSPITAL_COMMUNITY): Payer: Self-pay

## 2023-09-12 ENCOUNTER — Encounter (HOSPITAL_COMMUNITY)
Admission: RE | Admit: 2023-09-12 | Discharge: 2023-09-12 | Disposition: A | Discharge status: Home / Self Care | Payer: MEDICAID | Source: Ambulatory Visit | Attending: Urology | Admitting: Urology

## 2023-09-12 VITALS — Ht 72.0 in | Wt 180.0 lb

## 2023-09-12 DIAGNOSIS — Z01818 Encounter for other preprocedural examination: Secondary | ICD-10-CM

## 2023-09-12 NOTE — Progress Notes (Signed)
 For Anesthesia: PCP - NO PCP Cardiologist - N/A  Bowel Prep reminder:N/A  Chest x-ray -  EKG -  Stress Test -  ECHO -  Cardiac Cath -  Pacemaker/ICD device last checked: Pacemaker orders received: Device Rep notified:  Spinal Cord Stimulator:N/A  Sleep Study - N/A CPAP -   Fasting Blood Sugar - N/A Checks Blood Sugar _____ times a day Date and result of last Hgb A1c-  Last dose of GLP1 agonist- N/A GLP1 instructions:   Last dose of SGLT-2 inhibitors- N/A SGLT-2 instructions:   Blood Thinner Instructions: Aspirin Instructions:N/A Last Dose:  Activity level: Can go up a flight of stairs and activities of daily living without stopping and without chest pain and/or shortness of breath   Able to exercise without chest pain and/or shortness of breath  Anesthesia review:   Patient denies shortness of breath, fever, cough and chest pain at PAT appointment   Patient verbalized understanding of instructions that were reviewed over the telephone.

## 2023-09-16 ENCOUNTER — Encounter (HOSPITAL_COMMUNITY)
Admission: RE | Admit: 2023-09-16 | Discharge: 2023-09-16 | Disposition: A | Discharge status: Home / Self Care | Payer: MEDICAID | Source: Ambulatory Visit

## 2023-09-17 ENCOUNTER — Telehealth: Payer: Self-pay | Admitting: Urology

## 2023-09-17 NOTE — Progress Notes (Signed)
 Pt. Did not show for PST appointment ,the interview was done over the phone.He asked to schedule his labs for 09/16/23 but he did not came for that neither.Labs will be done the DOS.

## 2023-09-17 NOTE — Telephone Encounter (Signed)
 Spoke with Devon Lopez in pre admit. Devon Lopez stated she added lab work to pt orders for the day of surgery.  Spoke with pt and reinforced with him to show up to surgery on 7/16 and f/u appt 8/6. Also made pt aware labs will be drawn the day of surgery. Pt voiced understanding.

## 2023-09-17 NOTE — Telephone Encounter (Signed)
 Pt called about the blood work that he was supposed to have done last week before his procedure on the 5th of Aug. Pt stated he had family emergency and could not make it pt would like to r/s. Pt also wanted to confirm some info about the procedure but I seen that it was canceled but pt is not aware. Please Advise.

## 2023-09-25 ENCOUNTER — Encounter (HOSPITAL_COMMUNITY)
Admission: RE | Disposition: A | Discharge status: Home / Self Care | Payer: Self-pay | Source: Home / Self Care | Attending: Urology

## 2023-09-25 ENCOUNTER — Encounter (HOSPITAL_COMMUNITY): Payer: Self-pay | Admitting: Urology

## 2023-09-25 ENCOUNTER — Ambulatory Visit (HOSPITAL_COMMUNITY): Payer: MEDICAID | Admitting: Certified Registered Nurse Anesthetist

## 2023-09-25 ENCOUNTER — Ambulatory Visit (HOSPITAL_COMMUNITY)
Admission: RE | Admit: 2023-09-25 | Discharge: 2023-09-25 | Disposition: A | Discharge status: Home / Self Care | Payer: MEDICAID | Attending: Urology | Admitting: Urology

## 2023-09-25 DIAGNOSIS — N432 Other hydrocele: Secondary | ICD-10-CM | POA: Diagnosis present

## 2023-09-25 DIAGNOSIS — N433 Hydrocele, unspecified: Secondary | ICD-10-CM | POA: Insufficient documentation

## 2023-09-25 DIAGNOSIS — N430 Encysted hydrocele: Secondary | ICD-10-CM

## 2023-09-25 DIAGNOSIS — Z01818 Encounter for other preprocedural examination: Secondary | ICD-10-CM

## 2023-09-25 HISTORY — PX: HYDROCELE EXCISION: SHX482

## 2023-09-25 LAB — CBC
HCT: 47.5 % (ref 39.0–52.0)
Hemoglobin: 15.9 g/dL (ref 13.0–17.0)
MCH: 27.1 pg (ref 26.0–34.0)
MCHC: 33.5 g/dL (ref 30.0–36.0)
MCV: 80.9 fL (ref 80.0–100.0)
Platelets: 239 K/uL (ref 150–400)
RBC: 5.87 MIL/uL — ABNORMAL HIGH (ref 4.22–5.81)
RDW: 12 % (ref 11.5–15.5)
WBC: 6.8 K/uL (ref 4.0–10.5)
nRBC: 0 % (ref 0.0–0.2)

## 2023-09-25 SURGERY — HYDROCELECTOMY
Anesthesia: General | Laterality: Right

## 2023-09-25 MED ORDER — DEXMEDETOMIDINE HCL IN NACL 80 MCG/20ML IV SOLN
INTRAVENOUS | Status: DC | PRN
  Administered 2023-09-25: 12 ug via INTRAVENOUS

## 2023-09-25 MED ORDER — FENTANYL CITRATE (PF) 100 MCG/2ML IJ SOLN
INTRAMUSCULAR | Status: AC
  Filled 2023-09-25: qty 2

## 2023-09-25 MED ORDER — BUPIVACAINE HCL (PF) 0.25 % IJ SOLN
INTRAMUSCULAR | Status: AC
  Filled 2023-09-25: qty 30

## 2023-09-25 MED ORDER — KETOROLAC TROMETHAMINE 30 MG/ML IJ SOLN
INTRAMUSCULAR | Status: AC
  Filled 2023-09-25: qty 1

## 2023-09-25 MED ORDER — ONDANSETRON HCL 4 MG/2ML IJ SOLN
INTRAMUSCULAR | Status: DC | PRN
  Administered 2023-09-25: 4 mg via INTRAVENOUS

## 2023-09-25 MED ORDER — DEXAMETHASONE SODIUM PHOSPHATE 4 MG/ML IJ SOLN
INTRAMUSCULAR | Status: DC | PRN
  Administered 2023-09-25: 5 mg via INTRAVENOUS

## 2023-09-25 MED ORDER — OXYCODONE HCL 5 MG PO TABS: 5.0000 mg | ORAL_TABLET | Freq: Once | ORAL | Status: DC | PRN

## 2023-09-25 MED ORDER — AMISULPRIDE (ANTIEMETIC) 5 MG/2ML IV SOLN: 10.0000 mg | Freq: Once | INTRAVENOUS | Status: DC | PRN

## 2023-09-25 MED ORDER — FENTANYL CITRATE PF 50 MCG/ML IJ SOSY: 25.0000 ug | PREFILLED_SYRINGE | INTRAMUSCULAR | Status: DC | PRN

## 2023-09-25 MED ORDER — MIDAZOLAM HCL 2 MG/2ML IJ SOLN
INTRAMUSCULAR | Status: AC
  Filled 2023-09-25: qty 2

## 2023-09-25 MED ORDER — BUPIVACAINE HCL (PF) 0.25 % IJ SOLN
INTRAMUSCULAR | Status: DC | PRN
  Administered 2023-09-25: 10 mL

## 2023-09-25 MED ORDER — PROPOFOL 10 MG/ML IV BOLUS
INTRAVENOUS | Status: AC
  Filled 2023-09-25: qty 20

## 2023-09-25 MED ORDER — OXYCODONE HCL 5 MG/5ML PO SOLN: 5.0000 mg | Freq: Once | ORAL | Status: DC | PRN

## 2023-09-25 MED ORDER — CEFAZOLIN SODIUM-DEXTROSE 2-4 GM/100ML-% IV SOLN
2.0000 g | INTRAVENOUS | Status: AC
  Administered 2023-09-25: 2 g via INTRAVENOUS
  Filled 2023-09-25: qty 100

## 2023-09-25 MED ORDER — HYDROCODONE-ACETAMINOPHEN 5-325MG PREPACK (~~LOC~~: ORAL_TABLET | ORAL | 0 refills | Status: AC

## 2023-09-25 MED ORDER — ONDANSETRON HCL 4 MG/2ML IJ SOLN
INTRAMUSCULAR | Status: AC
  Filled 2023-09-25: qty 2

## 2023-09-25 MED ORDER — ACETAMINOPHEN 500 MG PO TABS
1000.0000 mg | ORAL_TABLET | Freq: Once | ORAL | Status: AC
  Administered 2023-09-25: 1000 mg via ORAL
  Filled 2023-09-25: qty 2

## 2023-09-25 MED ORDER — CHLORHEXIDINE GLUCONATE 0.12 % MT SOLN
15.0000 mL | Freq: Once | OROMUCOSAL | Status: AC
  Administered 2023-09-25: 15 mL via OROMUCOSAL

## 2023-09-25 MED ORDER — ORAL CARE MOUTH RINSE: 15.0000 mL | Freq: Once | OROMUCOSAL | Status: AC

## 2023-09-25 MED ORDER — PROPOFOL 10 MG/ML IV BOLUS
INTRAVENOUS | Status: DC | PRN
  Administered 2023-09-25: 200 mg via INTRAVENOUS

## 2023-09-25 MED ORDER — MIDAZOLAM HCL 5 MG/5ML IJ SOLN
INTRAMUSCULAR | Status: DC | PRN
  Administered 2023-09-25: 2 mg via INTRAVENOUS

## 2023-09-25 MED ORDER — FENTANYL CITRATE (PF) 100 MCG/2ML IJ SOLN
INTRAMUSCULAR | Status: DC | PRN
  Administered 2023-09-25 (×2): 50 ug via INTRAVENOUS

## 2023-09-25 MED ORDER — LACTATED RINGERS IV SOLN: INTRAVENOUS | Status: DC

## 2023-09-25 MED ORDER — LIDOCAINE HCL (CARDIAC) PF 100 MG/5ML IV SOSY
PREFILLED_SYRINGE | INTRAVENOUS | Status: DC | PRN
  Administered 2023-09-25: 50 mg via INTRAVENOUS

## 2023-09-25 MED ORDER — KETOROLAC TROMETHAMINE 30 MG/ML IJ SOLN
INTRAMUSCULAR | Status: DC | PRN
  Administered 2023-09-25: 30 mg via INTRAVENOUS

## 2023-09-25 MED ORDER — DEXAMETHASONE SODIUM PHOSPHATE 10 MG/ML IJ SOLN
INTRAMUSCULAR | Status: AC
Start: 2023-09-25 — End: 2023-09-25
  Filled 2023-09-25: qty 1

## 2023-09-25 MED ORDER — LIDOCAINE HCL (PF) 2 % IJ SOLN
INTRAMUSCULAR | Status: AC
  Filled 2023-09-25: qty 5

## 2023-09-25 SURGICAL SUPPLY — 27 items
BAG COUNTER SPONGE SURGICOUNT (BAG) IMPLANT
BLADE HEX COATED 2.75 (ELECTRODE) ×1 IMPLANT
BNDG GAUZE DERMACEA FLUFF 4 (GAUZE/BANDAGES/DRESSINGS) ×1 IMPLANT
COVER SURGICAL LIGHT HANDLE (MISCELLANEOUS) ×1 IMPLANT
DERMABOND ADVANCED .7 DNX12 (GAUZE/BANDAGES/DRESSINGS) ×1 IMPLANT
DRAIN PENROSE 0.25X18 (DRAIN) ×1 IMPLANT
DRAPE LAPAROTOMY T 98X78 PEDS (DRAPES) ×1 IMPLANT
ELECT REM PT RETURN 15FT ADLT (MISCELLANEOUS) ×1 IMPLANT
GLOVE SURG LX STRL 8.0 MICRO (GLOVE) ×1 IMPLANT
GOWN STRL REUS W/ TWL XL LVL3 (GOWN DISPOSABLE) ×2 IMPLANT
KIT BASIN OR (CUSTOM PROCEDURE TRAY) ×1 IMPLANT
KIT TURNOVER KIT A (KITS) ×1 IMPLANT
NDL HYPO 22X1.5 SAFETY MO (MISCELLANEOUS) IMPLANT
NEEDLE HYPO 22X1.5 SAFETY MO (MISCELLANEOUS) ×1 IMPLANT
NS IRRIG 1000ML POUR BTL (IV SOLUTION) ×1 IMPLANT
PACK GENERAL/GYN (CUSTOM PROCEDURE TRAY) ×1 IMPLANT
SUPPORTER AHLETIC TETRA LG (SOFTGOODS) ×1 IMPLANT
SUT CHROMIC 3 0 SH 27 (SUTURE) ×1 IMPLANT
SUT CHROMIC 4 0 SH 27 (SUTURE) IMPLANT
SUT MNCRL AB 4-0 PS2 18 (SUTURE) IMPLANT
SUT PROLENE 4-0 RB1 .5 CRCL 36 (SUTURE) ×1 IMPLANT
SUT VIC AB 2-0 UR5 27 (SUTURE) IMPLANT
SUT VIC AB 4-0 PS2 27 (SUTURE) ×1 IMPLANT
SUT VICRYL 0 TIES 12 18 (SUTURE) ×1 IMPLANT
SYR CONTROL 10ML LL (SYRINGE) IMPLANT
TOWEL OR 17X26 10 PK STRL BLUE (TOWEL DISPOSABLE) ×1 IMPLANT
WATER STERILE IRR 1000ML POUR (IV SOLUTION) ×1 IMPLANT

## 2023-09-25 NOTE — Discharge Instructions (Signed)
 Scrotal surgery postoperative instructions  Wound:  In most cases your incision will have absorbable sutures that will dissolve within the first 2-3 weeks. Some will fall out even earlier. Expect some redness as the sutures dissolve but this should occur only around the sutures. If there is generalized redness, especially with increasing pain or swelling, let us  know. The scrotum will very likely get black and blue as the blood in the tissues spread. Sometimes the whole scrotum will turn colors. The black and blue is followed by a yellow and brown color. In time, all the discoloration will go away. In some cases some firm swelling in the area of the testicle may persist for up to 4-6 weeks after the surgery and is considered normal in most cases.  Drain:  If the surgeon placed a drain through the bottom part of your scrotum, it is held in with a small stitch. When instructed, (next Monday) cut the small stitch and slide to drain out. Once the drain has been removed, a small hole made drain out for another day or 2. If so, keep a clean washcloth underneath your supportive undergarment, or sterile gauze. Until the hole seals up, all bathing should be in the shower, and not in the bathtub.  Diet:  You may return to your normal diet within 24 hours following your surgery. You may note some mild nausea and possibly vomiting the first 6-8 hours following surgery. This is usually due to the side effects of anesthesia, and will disappear quite soon. I would suggest clear liquids and a very light meal the first evening following your surgery.  Activity:  Your physical activity should be restricted the first 48 hours. During that time you should remain relatively inactive, moving about only when necessary. During the first 7-10 days following surgery you should avoid lifting any heavy objects (anything greater than 15 pounds), and avoid strenuous exercise. If you work, ask us  specifically about your  restrictions, both for work and home. We will write a note to your employer if needed.  You should plan to wear a tight pair of jockey shorts or an athletic supporter for the first 4-5 days, even to sleep. This will keep the scrotum immobilized to some degree and keep the swelling down. You may find it more comfortable to wear a support longer.  Ice packs should be placed on and off over the scrotum for the first 48 hours. Frozen peas or corn in a ZipLock bag can be frozen, used and re-frozen. Fifteen minutes on and 15 minutes off is a reasonable schedule. The ice is a good pain reliever and keeps the swelling down.  Hygiene:  You may shower 48 hours after your surgery. Tub bathing should be restricted until the seventh day.  Medication:  You will be sent home with some type of pain medication. In many cases you will be sent home with a narcotic pain pill (Vicodin or Tylox). If the pain is not too bad, you may take either Tylenol  (acetaminophen ) or Advil  (ibuprofen ) which contain no narcotic agents, and might be tolerated a little better, with fewer side effects. If the pain medication you are sent home with does not control the pain, you will have to let us  know. Some narcotic pain medications cannot be given or refilled by a phone call to a pharmacy.  Problems you should report to us :  Fever of 101.0 degrees Fahrenheit or greater. Moderate or severe swelling under the skin incision or involving the scrotum.  Drug reaction such as hives, a rash, nausea or vomiting.

## 2023-09-25 NOTE — Anesthesia Procedure Notes (Signed)
 Procedure Name: LMA Insertion Date/Time: 09/25/2023 8:48 AM  Performed by: Buster Catheryn SAUNDERS, CRNAPre-anesthesia Checklist: Patient identified, Emergency Drugs available, Suction available and Patient being monitored Patient Re-evaluated:Patient Re-evaluated prior to induction Oxygen Delivery Method: Circle system utilized Preoxygenation: Pre-oxygenation with 100% oxygen Induction Type: IV induction Ventilation: Mask ventilation without difficulty LMA: LMA inserted LMA Size: 4.0 Number of attempts: 1 Placement Confirmation: positive ETCO2 Tube secured with: Tape Dental Injury: Teeth and Oropharynx as per pre-operative assessment

## 2023-09-25 NOTE — Op Note (Signed)
Preoperative diagnosis: Right hydrocele   Postoperative diagnosis: Same   Procedure:Right hydrocele repair   Surgeon: Aylissa Heinemann M. Kolbie Lepkowski, M.D.   Anesthesia: Gen.   Indications: Patient presented with scrotal swelling. A hydrocele was confirmed with scrotal ultrasonography. The patient was symptomatic from his hydrocele and requested surgical intervention. He appeared to understand the risks benefits potential complications of this procedure.   Procedure: The patient was properly identified and marked in the holding room. He received preoperative IV antibiotics. He was then taken to the operating room where general anesthetic was administered using the LMA. The scrotum was then prepped and draped in the usual manner. Appropriate surgical timeout was performed. An incision was made in the median raphae of the scrotum. The hydrocele was encountered which was freed from the scrotal wall and then the testis and hydrocele sac were delivered from the incision. The hydrocele sac was then incised anteriorly and a large amount of fluid was obtained. The testis was carefully inspected and no other pathology was appreciated. The hydrocele sac was moderate in thickness.  The sac was then plicated posteriorly with a running 3-0 chromic suture. The spermatic cord block was then performed with  10 ml of 0.25% plain Marcaine. The testis was returned to the hemiscrotum taking great care to make sure that there was no twisting of the spermatic cord. Inspection of the inside of the scrotum revealed no significant bleeding. A quarter-inch Penrose was brought through the lower aspect of the scrotum into the affected hemiscrotum and sutured to the skin. The scrotal incision was then closed anatomically, first with a running 3-0 chromic reapproximating the dartos fascia, and then a 4-0 Monocryl used to close the skin in a simple running fashion. A fluff dressing and jockstrap was then placed Sponge and needle counts were  correct. No obvious complications occurred and the patient was brought to recovery room in stable condition.        

## 2023-09-25 NOTE — Interval H&P Note (Signed)
 History and Physical Interval Note:  09/25/2023 8:35 AM  Devon Lopez  has presented today for surgery, with the diagnosis of Type of hydrocele.  The various methods of treatment have been discussed with the patient and family. After consideration of risks, benefits and other options for treatment, the patient has consented to  Procedure(s): HYDROCELECTOMY (Right) as a surgical intervention.  The patient's history has been reviewed, patient examined, no change in status, stable for surgery.  I have reviewed the patient's chart and labs.  Questions were answered to the patient's satisfaction.     Garnette HERO Rickayla Wieland

## 2023-09-25 NOTE — Anesthesia Postprocedure Evaluation (Signed)
 Anesthesia Post Note  Patient: Devon Lopez  Procedure(s) Performed: HYDROCELECTOMY (Right)     Patient location during evaluation: PACU Anesthesia Type: General Level of consciousness: awake and alert Pain management: pain level controlled Vital Signs Assessment: post-procedure vital signs reviewed and stable Respiratory status: spontaneous breathing, nonlabored ventilation, respiratory function stable and patient connected to nasal cannula oxygen Cardiovascular status: blood pressure returned to baseline and stable Postop Assessment: no apparent nausea or vomiting Anesthetic complications: no   No notable events documented.  Last Vitals:  Vitals:   09/25/23 1030 09/25/23 1045  BP: (!) 109/53   Pulse: 62   Resp: 10 12  Temp: (!) 36.4 C   SpO2: 99%     Last Pain:  Vitals:   09/25/23 1045  TempSrc:   PainSc: 0-No pain                 Marletta Bousquet L Tamanna Whitson

## 2023-09-25 NOTE — Transfer of Care (Signed)
 Immediate Anesthesia Transfer of Care Note  Patient: Devon Lopez  Procedure(s) Performed: HYDROCELECTOMY (Right)  Patient Location: PACU  Anesthesia Type:General  Level of Consciousness: awake, alert , and oriented  Airway & Oxygen Therapy: Patient Spontanous Breathing and Patient connected to face mask oxygen  Post-op Assessment: Report given to RN and Post -op Vital signs reviewed and stable  Post vital signs: Reviewed and stable  Last Vitals:  Vitals Value Taken Time  BP 122/101 09/25/23 09:35  Temp    Pulse 49 09/25/23 09:37  Resp 9 09/25/23 09:37  SpO2 100 % 09/25/23 09:37  Vitals shown include unfiled device data.  Last Pain:  Vitals:   09/25/23 0811  TempSrc: Oral  PainSc:          Complications: No notable events documented.

## 2023-09-25 NOTE — H&P (Signed)
 H&P  Chief Complaint: Rt hydrocele  History of Present Illness: 23 yo male presents for Rt hydrocelectomy.  Past Medical History:  Diagnosis Date   Anxiety    Bipolar 2 disorder (HCC)    Depression    PTSD (post-traumatic stress disorder)    Suicide attempt (HCC)    TBI (traumatic brain injury) (HCC) 2016    Past Surgical History:  Procedure Laterality Date   WISDOM TOOTH EXTRACTION      Home Medications:    Allergies:  Allergies  Allergen Reactions   Banana Diarrhea   Pineapple Diarrhea    History reviewed. No pertinent family history.  Social History:  reports that he has never smoked. He has never used smokeless tobacco. He reports current alcohol use. He reports current drug use. Drug: Marijuana.  ROS: A complete review of systems was performed.  All systems are negative except for pertinent findings as noted.  Physical Exam:  Vital signs in last 24 hours: BP (!) 128/94   Pulse 74   Temp 97.9 F (36.6 C) (Oral)   Resp 16   Ht 6' (1.829 m)   Wt 83.9 kg   SpO2 96%   BMI 25.09 kg/m  Constitutional:  Alert and oriented, No acute distress Cardiovascular: Regular rate  Respiratory: Normal respiratory effort GI: Abdomen is soft, nontender, nondistended, no abdominal masses. No CVAT.  Genitourinary: Rt hydrocele Lymphatic: No lymphadenopathy Neurologic: Grossly intact, no focal deficits Psychiatric: Normal mood and affect  I have reviewed prior pt notes  Impression/Assessment:  Rt hydrocele--symptomatic  Plan:  Rt hydrocelectomy

## 2023-09-25 NOTE — Anesthesia Preprocedure Evaluation (Addendum)
 Anesthesia Evaluation  Patient identified by MRN, date of birth, ID band Patient awake    Reviewed: Allergy & Precautions, NPO status , Patient's Chart, lab work & pertinent test results  Airway Mallampati: III  TM Distance: >3 FB Neck ROM: Full    Dental  (+) Caps, Dental Advisory Given   Pulmonary neg pulmonary ROS   Pulmonary exam normal breath sounds clear to auscultation       Cardiovascular negative cardio ROS Normal cardiovascular exam Rhythm:Regular Rate:Normal     Neuro/Psych  PSYCHIATRIC DISORDERS Anxiety Depression Bipolar Disorder   negative neurological ROS     GI/Hepatic negative GI ROS,,,(+)     substance abuse  marijuana use  Endo/Other  negative endocrine ROS    Renal/GU negative Renal ROS  negative genitourinary   Musculoskeletal negative musculoskeletal ROS (+)    Abdominal   Peds  Hematology negative hematology ROS (+)   Anesthesia Other Findings   Reproductive/Obstetrics                              Anesthesia Physical Anesthesia Plan  ASA: 2  Anesthesia Plan: General   Post-op Pain Management: Precedex  and Tylenol  PO (pre-op)*   Induction: Intravenous  PONV Risk Score and Plan: 2 and Ondansetron , Dexamethasone  and Midazolam   Airway Management Planned: LMA  Additional Equipment:   Intra-op Plan:   Post-operative Plan: Extubation in OR  Informed Consent: I have reviewed the patients History and Physical, chart, labs and discussed the procedure including the risks, benefits and alternatives for the proposed anesthesia with the patient or authorized representative who has indicated his/her understanding and acceptance.     Dental advisory given  Plan Discussed with: CRNA  Anesthesia Plan Comments:          Anesthesia Quick Evaluation

## 2023-09-26 ENCOUNTER — Telehealth: Payer: Self-pay

## 2023-09-26 ENCOUNTER — Encounter (HOSPITAL_COMMUNITY): Payer: Self-pay | Admitting: Urology

## 2023-09-26 NOTE — Telephone Encounter (Signed)
 Pt called after hours line several times last night, starting at 6:17p. Pt stated that the vicodin is not helping much with the pain. Spoke with pt this morning. He states that his pain is 8-9/10 when getting time to take vicodin and 7-8/10 after taking vicodin. Pt is requesting something stronger. Please advise.

## 2023-09-27 NOTE — Telephone Encounter (Signed)
 Spoke with pt in reference to pain and pain medication. Pt was disappointed but voiced understanding.

## 2023-09-30 ENCOUNTER — Ambulatory Visit: Admit: 2023-09-30 | Payer: MEDICAID | Admitting: Urology

## 2023-09-30 SURGERY — HYDROCELECTOMY
Anesthesia: General | Laterality: Right

## 2023-10-03 ENCOUNTER — Encounter: Payer: Self-pay | Admitting: Urology

## 2023-10-15 ENCOUNTER — Encounter: Payer: MEDICAID | Admitting: Urology

## 2023-10-16 ENCOUNTER — Ambulatory Visit: Payer: MEDICAID | Admitting: Urology

## 2023-10-16 VITALS — BP 128/76 | HR 76 | Ht 72.0 in | Wt 180.0 lb

## 2023-10-16 DIAGNOSIS — N432 Other hydrocele: Secondary | ICD-10-CM

## 2023-10-16 NOTE — Progress Notes (Signed)
  History of Present Illness: Devon Lopez is here today for follow-up of right hydrocelectomy performed on the 16th of last month.  He had no real issues since that time.  No current pain.  He is happy with the appearance.  Past Medical History:  Diagnosis Date   Anxiety    Bipolar 2 disorder (HCC)    Depression    PTSD (post-traumatic stress disorder)    Suicide attempt [facility])    TBI (traumatic brain injury) (HCC) 2016    Past Surgical History:  Procedure Laterality Date   HYDROCELE EXCISION Right 09/25/2023   Procedure: HYDROCELECTOMY;  Surgeon: Matilda Senior, MD;  Location: [facility];  Service: Urology;  Laterality: Right;   WISDOM TOOTH EXTRACTION      Home Medications:  Allergies as of 10/16/2023       Reactions   Banana Diarrhea   Pineapple Diarrhea        Medication List        Accurate as of October 16, 2023 10:32 AM. If you have any questions, ask your nurse or doctor.          HYDROcodone -acetaminophen  5-325 mg Tabs tablet Commonly known as: VICODIN 1 tab po q 6-8 hrs prn severe pain   HYDROcodone -acetaminophen  5-325 mg Tabs tablet Commonly known as: VICODIN 1 tab po q 6 hr prn severe pain   ibuprofen  800 MG tablet Commonly known as: ADVIL  Take 1 tablet (800 mg total) by mouth every 6 (six) hours as needed for moderate pain (pain score 4-6).        Allergies:  Allergies  Allergen Reactions   Banana Diarrhea   Pineapple Diarrhea    No family history on file.  Social History:  reports that he has never smoked. He has never used smokeless tobacco. He reports current alcohol use. He reports current drug use. Drug: Marijuana.  ROS: A complete review of systems was performed.  All systems are negative except for pertinent findings as noted.  Physical Exam:  Vital signs in last 24 hours: BP 128/76   Pulse 76   Ht 6' (1.829 m)   Wt 180 lb (81.6 kg)   BMI 24.41 kg/m  Constitutional:  Alert and oriented, No acute distress Cardiovascular: Regular  rate  Respiratory: Normal respiratory effort Scrotum: Well-healed midline incision.  Mild induration of the right testicle and paratesticular tissues.  No erythema, no fluid palpable. Lymphatic: No lymphadenopathy Neurologic: Grossly intact, no focal deficits Psychiatric: Normal mood and affect     Impression/Assessment:  Status post right hydrocelectomy, doing well  Plan:  I will have him come back on an as-needed basis
